# Patient Record
Sex: Male | Born: 1935 | Race: White | Hispanic: No | Marital: Married | Smoking: Never smoker
Health system: Southern US, Community
[De-identification: ages and names within clinical notes are randomized; demographics above are authoritative.]

## PROBLEM LIST (undated history)

## (undated) DIAGNOSIS — F0390 Unspecified dementia without behavioral disturbance: Secondary | ICD-10-CM

## (undated) DIAGNOSIS — E119 Type 2 diabetes mellitus without complications: Secondary | ICD-10-CM

---

## 2017-01-20 ENCOUNTER — Other Ambulatory Visit: Payer: Self-pay | Admitting: Internal Medicine

## 2017-01-20 DIAGNOSIS — R413 Other amnesia: Secondary | ICD-10-CM

## 2017-02-07 ENCOUNTER — Ambulatory Visit
Admission: RE | Admit: 2017-02-07 | Discharge: 2017-02-07 | Disposition: A | Discharge status: Home / Self Care | Payer: Medicare PPO | Source: Ambulatory Visit | Attending: Internal Medicine | Admitting: Internal Medicine

## 2017-02-07 DIAGNOSIS — R413 Other amnesia: Secondary | ICD-10-CM

## 2017-04-19 DIAGNOSIS — Z681 Body mass index (BMI) 19 or less, adult: Secondary | ICD-10-CM | POA: Diagnosis not present

## 2017-04-19 DIAGNOSIS — E119 Type 2 diabetes mellitus without complications: Secondary | ICD-10-CM | POA: Diagnosis not present

## 2017-04-19 DIAGNOSIS — R413 Other amnesia: Secondary | ICD-10-CM | POA: Diagnosis not present

## 2017-04-19 DIAGNOSIS — D696 Thrombocytopenia, unspecified: Secondary | ICD-10-CM | POA: Diagnosis not present

## 2017-04-19 DIAGNOSIS — E559 Vitamin D deficiency, unspecified: Secondary | ICD-10-CM | POA: Diagnosis not present

## 2017-04-19 DIAGNOSIS — R945 Abnormal results of liver function studies: Secondary | ICD-10-CM | POA: Diagnosis not present

## 2017-08-10 DIAGNOSIS — E119 Type 2 diabetes mellitus without complications: Secondary | ICD-10-CM | POA: Diagnosis not present

## 2017-08-10 DIAGNOSIS — Z681 Body mass index (BMI) 19 or less, adult: Secondary | ICD-10-CM | POA: Diagnosis not present

## 2017-08-10 DIAGNOSIS — K8681 Exocrine pancreatic insufficiency: Secondary | ICD-10-CM | POA: Diagnosis not present

## 2017-08-10 DIAGNOSIS — R634 Abnormal weight loss: Secondary | ICD-10-CM | POA: Diagnosis not present

## 2017-08-10 DIAGNOSIS — R413 Other amnesia: Secondary | ICD-10-CM | POA: Diagnosis not present

## 2017-09-02 DIAGNOSIS — H903 Sensorineural hearing loss, bilateral: Secondary | ICD-10-CM | POA: Diagnosis not present

## 2017-09-02 DIAGNOSIS — H6122 Impacted cerumen, left ear: Secondary | ICD-10-CM | POA: Diagnosis not present

## 2017-09-07 DIAGNOSIS — H903 Sensorineural hearing loss, bilateral: Secondary | ICD-10-CM | POA: Diagnosis not present

## 2017-11-23 DIAGNOSIS — R634 Abnormal weight loss: Secondary | ICD-10-CM | POA: Diagnosis not present

## 2017-11-23 DIAGNOSIS — R600 Localized edema: Secondary | ICD-10-CM | POA: Diagnosis not present

## 2017-11-23 DIAGNOSIS — E1169 Type 2 diabetes mellitus with other specified complication: Secondary | ICD-10-CM | POA: Diagnosis not present

## 2017-11-23 DIAGNOSIS — Z681 Body mass index (BMI) 19 or less, adult: Secondary | ICD-10-CM | POA: Diagnosis not present

## 2017-11-23 DIAGNOSIS — Z23 Encounter for immunization: Secondary | ICD-10-CM | POA: Diagnosis not present

## 2017-11-23 DIAGNOSIS — K8681 Exocrine pancreatic insufficiency: Secondary | ICD-10-CM | POA: Diagnosis not present

## 2017-11-23 DIAGNOSIS — R413 Other amnesia: Secondary | ICD-10-CM | POA: Diagnosis not present

## 2017-11-23 DIAGNOSIS — D696 Thrombocytopenia, unspecified: Secondary | ICD-10-CM | POA: Diagnosis not present

## 2018-02-27 DIAGNOSIS — E1169 Type 2 diabetes mellitus with other specified complication: Secondary | ICD-10-CM | POA: Diagnosis not present

## 2018-02-27 DIAGNOSIS — Z79899 Other long term (current) drug therapy: Secondary | ICD-10-CM | POA: Diagnosis not present

## 2018-02-27 DIAGNOSIS — R82998 Other abnormal findings in urine: Secondary | ICD-10-CM | POA: Diagnosis not present

## 2018-03-02 DIAGNOSIS — E559 Vitamin D deficiency, unspecified: Secondary | ICD-10-CM | POA: Diagnosis not present

## 2018-03-02 DIAGNOSIS — Z681 Body mass index (BMI) 19 or less, adult: Secondary | ICD-10-CM | POA: Diagnosis not present

## 2018-03-02 DIAGNOSIS — E876 Hypokalemia: Secondary | ICD-10-CM | POA: Diagnosis not present

## 2018-03-02 DIAGNOSIS — E1169 Type 2 diabetes mellitus with other specified complication: Secondary | ICD-10-CM | POA: Diagnosis not present

## 2018-03-02 DIAGNOSIS — R945 Abnormal results of liver function studies: Secondary | ICD-10-CM | POA: Diagnosis not present

## 2018-03-02 DIAGNOSIS — R808 Other proteinuria: Secondary | ICD-10-CM | POA: Diagnosis not present

## 2018-03-02 DIAGNOSIS — K8681 Exocrine pancreatic insufficiency: Secondary | ICD-10-CM | POA: Diagnosis not present

## 2018-03-02 DIAGNOSIS — Z0000 Encounter for general adult medical examination without abnormal findings: Secondary | ICD-10-CM | POA: Diagnosis not present

## 2018-03-02 DIAGNOSIS — Z1389 Encounter for screening for other disorder: Secondary | ICD-10-CM | POA: Diagnosis not present

## 2018-03-02 DIAGNOSIS — D696 Thrombocytopenia, unspecified: Secondary | ICD-10-CM | POA: Diagnosis not present

## 2018-03-02 DIAGNOSIS — R413 Other amnesia: Secondary | ICD-10-CM | POA: Diagnosis not present

## 2018-04-24 DIAGNOSIS — B86 Scabies: Secondary | ICD-10-CM | POA: Diagnosis not present

## 2018-08-09 DIAGNOSIS — E1169 Type 2 diabetes mellitus with other specified complication: Secondary | ICD-10-CM | POA: Diagnosis not present

## 2018-08-16 DIAGNOSIS — E1169 Type 2 diabetes mellitus with other specified complication: Secondary | ICD-10-CM | POA: Diagnosis not present

## 2018-08-16 DIAGNOSIS — F0390 Unspecified dementia without behavioral disturbance: Secondary | ICD-10-CM | POA: Diagnosis not present

## 2018-08-16 DIAGNOSIS — K8681 Exocrine pancreatic insufficiency: Secondary | ICD-10-CM | POA: Diagnosis not present

## 2018-08-16 DIAGNOSIS — E559 Vitamin D deficiency, unspecified: Secondary | ICD-10-CM | POA: Diagnosis not present

## 2018-08-16 DIAGNOSIS — R609 Edema, unspecified: Secondary | ICD-10-CM | POA: Diagnosis not present

## 2018-08-16 DIAGNOSIS — D696 Thrombocytopenia, unspecified: Secondary | ICD-10-CM | POA: Diagnosis not present

## 2018-12-29 DIAGNOSIS — Z23 Encounter for immunization: Secondary | ICD-10-CM | POA: Diagnosis not present

## 2019-04-12 DIAGNOSIS — R7989 Other specified abnormal findings of blood chemistry: Secondary | ICD-10-CM | POA: Diagnosis not present

## 2019-04-12 DIAGNOSIS — E559 Vitamin D deficiency, unspecified: Secondary | ICD-10-CM | POA: Diagnosis not present

## 2019-04-12 DIAGNOSIS — E1169 Type 2 diabetes mellitus with other specified complication: Secondary | ICD-10-CM | POA: Diagnosis not present

## 2019-04-12 DIAGNOSIS — Z125 Encounter for screening for malignant neoplasm of prostate: Secondary | ICD-10-CM | POA: Diagnosis not present

## 2019-04-16 DIAGNOSIS — E1169 Type 2 diabetes mellitus with other specified complication: Secondary | ICD-10-CM | POA: Diagnosis not present

## 2019-04-16 DIAGNOSIS — R82998 Other abnormal findings in urine: Secondary | ICD-10-CM | POA: Diagnosis not present

## 2019-04-18 DIAGNOSIS — F0390 Unspecified dementia without behavioral disturbance: Secondary | ICD-10-CM | POA: Diagnosis not present

## 2019-04-18 DIAGNOSIS — E876 Hypokalemia: Secondary | ICD-10-CM | POA: Diagnosis not present

## 2019-04-18 DIAGNOSIS — Z1331 Encounter for screening for depression: Secondary | ICD-10-CM | POA: Diagnosis not present

## 2019-04-18 DIAGNOSIS — Z0000 Encounter for general adult medical examination without abnormal findings: Secondary | ICD-10-CM | POA: Diagnosis not present

## 2019-04-18 DIAGNOSIS — E559 Vitamin D deficiency, unspecified: Secondary | ICD-10-CM | POA: Diagnosis not present

## 2019-04-18 DIAGNOSIS — R945 Abnormal results of liver function studies: Secondary | ICD-10-CM | POA: Diagnosis not present

## 2019-04-18 DIAGNOSIS — R634 Abnormal weight loss: Secondary | ICD-10-CM | POA: Diagnosis not present

## 2019-04-18 DIAGNOSIS — D696 Thrombocytopenia, unspecified: Secondary | ICD-10-CM | POA: Diagnosis not present

## 2019-04-18 DIAGNOSIS — E1169 Type 2 diabetes mellitus with other specified complication: Secondary | ICD-10-CM | POA: Diagnosis not present

## 2019-04-18 DIAGNOSIS — K8681 Exocrine pancreatic insufficiency: Secondary | ICD-10-CM | POA: Diagnosis not present

## 2019-08-28 DIAGNOSIS — E1169 Type 2 diabetes mellitus with other specified complication: Secondary | ICD-10-CM | POA: Diagnosis not present

## 2020-01-16 DIAGNOSIS — D696 Thrombocytopenia, unspecified: Secondary | ICD-10-CM | POA: Diagnosis not present

## 2020-01-16 DIAGNOSIS — F0390 Unspecified dementia without behavioral disturbance: Secondary | ICD-10-CM | POA: Diagnosis not present

## 2020-01-16 DIAGNOSIS — K8681 Exocrine pancreatic insufficiency: Secondary | ICD-10-CM | POA: Diagnosis not present

## 2020-01-16 DIAGNOSIS — E1169 Type 2 diabetes mellitus with other specified complication: Secondary | ICD-10-CM | POA: Diagnosis not present

## 2020-01-16 DIAGNOSIS — E876 Hypokalemia: Secondary | ICD-10-CM | POA: Diagnosis not present

## 2020-04-22 DIAGNOSIS — Z125 Encounter for screening for malignant neoplasm of prostate: Secondary | ICD-10-CM | POA: Diagnosis not present

## 2020-04-22 DIAGNOSIS — E1169 Type 2 diabetes mellitus with other specified complication: Secondary | ICD-10-CM | POA: Diagnosis not present

## 2020-04-22 DIAGNOSIS — E559 Vitamin D deficiency, unspecified: Secondary | ICD-10-CM | POA: Diagnosis not present

## 2020-04-30 DIAGNOSIS — Z0000 Encounter for general adult medical examination without abnormal findings: Secondary | ICD-10-CM | POA: Diagnosis not present

## 2020-04-30 DIAGNOSIS — K8681 Exocrine pancreatic insufficiency: Secondary | ICD-10-CM | POA: Diagnosis not present

## 2020-04-30 DIAGNOSIS — Z23 Encounter for immunization: Secondary | ICD-10-CM | POA: Diagnosis not present

## 2020-04-30 DIAGNOSIS — R609 Edema, unspecified: Secondary | ICD-10-CM | POA: Diagnosis not present

## 2020-04-30 DIAGNOSIS — E1169 Type 2 diabetes mellitus with other specified complication: Secondary | ICD-10-CM | POA: Diagnosis not present

## 2020-04-30 DIAGNOSIS — F0390 Unspecified dementia without behavioral disturbance: Secondary | ICD-10-CM | POA: Diagnosis not present

## 2020-04-30 DIAGNOSIS — E559 Vitamin D deficiency, unspecified: Secondary | ICD-10-CM | POA: Diagnosis not present

## 2020-04-30 DIAGNOSIS — R82998 Other abnormal findings in urine: Secondary | ICD-10-CM | POA: Diagnosis not present

## 2020-04-30 DIAGNOSIS — L989 Disorder of the skin and subcutaneous tissue, unspecified: Secondary | ICD-10-CM | POA: Diagnosis not present

## 2020-04-30 DIAGNOSIS — R809 Proteinuria, unspecified: Secondary | ICD-10-CM | POA: Diagnosis not present

## 2020-04-30 DIAGNOSIS — R413 Other amnesia: Secondary | ICD-10-CM | POA: Diagnosis not present

## 2020-05-19 DIAGNOSIS — Z1159 Encounter for screening for other viral diseases: Secondary | ICD-10-CM | POA: Diagnosis not present

## 2020-05-19 DIAGNOSIS — Z20828 Contact with and (suspected) exposure to other viral communicable diseases: Secondary | ICD-10-CM | POA: Diagnosis not present

## 2020-05-26 DIAGNOSIS — Z20828 Contact with and (suspected) exposure to other viral communicable diseases: Secondary | ICD-10-CM | POA: Diagnosis not present

## 2020-05-26 DIAGNOSIS — Z1159 Encounter for screening for other viral diseases: Secondary | ICD-10-CM | POA: Diagnosis not present

## 2020-06-02 DIAGNOSIS — Z20828 Contact with and (suspected) exposure to other viral communicable diseases: Secondary | ICD-10-CM | POA: Diagnosis not present

## 2020-06-02 DIAGNOSIS — Z1159 Encounter for screening for other viral diseases: Secondary | ICD-10-CM | POA: Diagnosis not present

## 2020-06-09 DIAGNOSIS — Z1159 Encounter for screening for other viral diseases: Secondary | ICD-10-CM | POA: Diagnosis not present

## 2020-06-09 DIAGNOSIS — Z20828 Contact with and (suspected) exposure to other viral communicable diseases: Secondary | ICD-10-CM | POA: Diagnosis not present

## 2020-06-16 DIAGNOSIS — Z1159 Encounter for screening for other viral diseases: Secondary | ICD-10-CM | POA: Diagnosis not present

## 2020-06-16 DIAGNOSIS — Z20828 Contact with and (suspected) exposure to other viral communicable diseases: Secondary | ICD-10-CM | POA: Diagnosis not present

## 2020-06-23 DIAGNOSIS — Z20828 Contact with and (suspected) exposure to other viral communicable diseases: Secondary | ICD-10-CM | POA: Diagnosis not present

## 2020-06-23 DIAGNOSIS — Z1159 Encounter for screening for other viral diseases: Secondary | ICD-10-CM | POA: Diagnosis not present

## 2020-06-30 DIAGNOSIS — Z1159 Encounter for screening for other viral diseases: Secondary | ICD-10-CM | POA: Diagnosis not present

## 2020-06-30 DIAGNOSIS — Z20828 Contact with and (suspected) exposure to other viral communicable diseases: Secondary | ICD-10-CM | POA: Diagnosis not present

## 2020-07-07 DIAGNOSIS — Z1159 Encounter for screening for other viral diseases: Secondary | ICD-10-CM | POA: Diagnosis not present

## 2020-07-07 DIAGNOSIS — Z20828 Contact with and (suspected) exposure to other viral communicable diseases: Secondary | ICD-10-CM | POA: Diagnosis not present

## 2020-07-14 DIAGNOSIS — Z1159 Encounter for screening for other viral diseases: Secondary | ICD-10-CM | POA: Diagnosis not present

## 2020-07-14 DIAGNOSIS — Z20828 Contact with and (suspected) exposure to other viral communicable diseases: Secondary | ICD-10-CM | POA: Diagnosis not present

## 2020-07-21 DIAGNOSIS — Z20828 Contact with and (suspected) exposure to other viral communicable diseases: Secondary | ICD-10-CM | POA: Diagnosis not present

## 2020-07-21 DIAGNOSIS — Z1159 Encounter for screening for other viral diseases: Secondary | ICD-10-CM | POA: Diagnosis not present

## 2020-07-28 DIAGNOSIS — Z20828 Contact with and (suspected) exposure to other viral communicable diseases: Secondary | ICD-10-CM | POA: Diagnosis not present

## 2020-07-28 DIAGNOSIS — Z1159 Encounter for screening for other viral diseases: Secondary | ICD-10-CM | POA: Diagnosis not present

## 2020-08-04 DIAGNOSIS — Z1159 Encounter for screening for other viral diseases: Secondary | ICD-10-CM | POA: Diagnosis not present

## 2020-08-04 DIAGNOSIS — Z20828 Contact with and (suspected) exposure to other viral communicable diseases: Secondary | ICD-10-CM | POA: Diagnosis not present

## 2020-08-11 DIAGNOSIS — Z1159 Encounter for screening for other viral diseases: Secondary | ICD-10-CM | POA: Diagnosis not present

## 2020-08-11 DIAGNOSIS — Z20828 Contact with and (suspected) exposure to other viral communicable diseases: Secondary | ICD-10-CM | POA: Diagnosis not present

## 2020-08-18 DIAGNOSIS — Z20828 Contact with and (suspected) exposure to other viral communicable diseases: Secondary | ICD-10-CM | POA: Diagnosis not present

## 2020-08-18 DIAGNOSIS — Z1159 Encounter for screening for other viral diseases: Secondary | ICD-10-CM | POA: Diagnosis not present

## 2020-08-25 DIAGNOSIS — Z20828 Contact with and (suspected) exposure to other viral communicable diseases: Secondary | ICD-10-CM | POA: Diagnosis not present

## 2020-08-25 DIAGNOSIS — Z1159 Encounter for screening for other viral diseases: Secondary | ICD-10-CM | POA: Diagnosis not present

## 2020-09-01 DIAGNOSIS — Z1159 Encounter for screening for other viral diseases: Secondary | ICD-10-CM | POA: Diagnosis not present

## 2020-09-01 DIAGNOSIS — Z20828 Contact with and (suspected) exposure to other viral communicable diseases: Secondary | ICD-10-CM | POA: Diagnosis not present

## 2020-09-08 DIAGNOSIS — Z1159 Encounter for screening for other viral diseases: Secondary | ICD-10-CM | POA: Diagnosis not present

## 2020-09-08 DIAGNOSIS — Z20828 Contact with and (suspected) exposure to other viral communicable diseases: Secondary | ICD-10-CM | POA: Diagnosis not present

## 2020-09-15 DIAGNOSIS — Z1159 Encounter for screening for other viral diseases: Secondary | ICD-10-CM | POA: Diagnosis not present

## 2020-09-15 DIAGNOSIS — Z20828 Contact with and (suspected) exposure to other viral communicable diseases: Secondary | ICD-10-CM | POA: Diagnosis not present

## 2020-09-22 DIAGNOSIS — Z20828 Contact with and (suspected) exposure to other viral communicable diseases: Secondary | ICD-10-CM | POA: Diagnosis not present

## 2020-09-22 DIAGNOSIS — Z1159 Encounter for screening for other viral diseases: Secondary | ICD-10-CM | POA: Diagnosis not present

## 2020-10-06 DIAGNOSIS — Z1159 Encounter for screening for other viral diseases: Secondary | ICD-10-CM | POA: Diagnosis not present

## 2020-10-06 DIAGNOSIS — Z20828 Contact with and (suspected) exposure to other viral communicable diseases: Secondary | ICD-10-CM | POA: Diagnosis not present

## 2020-10-13 DIAGNOSIS — Z1159 Encounter for screening for other viral diseases: Secondary | ICD-10-CM | POA: Diagnosis not present

## 2020-10-13 DIAGNOSIS — Z20828 Contact with and (suspected) exposure to other viral communicable diseases: Secondary | ICD-10-CM | POA: Diagnosis not present

## 2020-11-12 DIAGNOSIS — F0390 Unspecified dementia without behavioral disturbance: Secondary | ICD-10-CM | POA: Diagnosis not present

## 2020-11-12 DIAGNOSIS — E1169 Type 2 diabetes mellitus with other specified complication: Secondary | ICD-10-CM | POA: Diagnosis not present

## 2020-11-12 DIAGNOSIS — D696 Thrombocytopenia, unspecified: Secondary | ICD-10-CM | POA: Diagnosis not present

## 2020-12-12 DIAGNOSIS — E1169 Type 2 diabetes mellitus with other specified complication: Secondary | ICD-10-CM | POA: Diagnosis not present

## 2020-12-12 DIAGNOSIS — D696 Thrombocytopenia, unspecified: Secondary | ICD-10-CM | POA: Diagnosis not present

## 2020-12-12 DIAGNOSIS — F0390 Unspecified dementia without behavioral disturbance: Secondary | ICD-10-CM | POA: Diagnosis not present

## 2021-02-11 DIAGNOSIS — F0390 Unspecified dementia without behavioral disturbance: Secondary | ICD-10-CM | POA: Diagnosis not present

## 2021-02-11 DIAGNOSIS — E1169 Type 2 diabetes mellitus with other specified complication: Secondary | ICD-10-CM | POA: Diagnosis not present

## 2021-02-11 DIAGNOSIS — K8681 Exocrine pancreatic insufficiency: Secondary | ICD-10-CM | POA: Diagnosis not present

## 2021-02-23 DIAGNOSIS — F0390 Unspecified dementia without behavioral disturbance: Secondary | ICD-10-CM | POA: Diagnosis not present

## 2021-02-23 DIAGNOSIS — R609 Edema, unspecified: Secondary | ICD-10-CM | POA: Diagnosis not present

## 2021-02-23 DIAGNOSIS — R945 Abnormal results of liver function studies: Secondary | ICD-10-CM | POA: Diagnosis not present

## 2021-02-23 DIAGNOSIS — D696 Thrombocytopenia, unspecified: Secondary | ICD-10-CM | POA: Diagnosis not present

## 2021-02-23 DIAGNOSIS — E1169 Type 2 diabetes mellitus with other specified complication: Secondary | ICD-10-CM | POA: Diagnosis not present

## 2021-02-23 DIAGNOSIS — K8681 Exocrine pancreatic insufficiency: Secondary | ICD-10-CM | POA: Diagnosis not present

## 2021-02-23 DIAGNOSIS — Z23 Encounter for immunization: Secondary | ICD-10-CM | POA: Diagnosis not present

## 2021-02-23 DIAGNOSIS — E876 Hypokalemia: Secondary | ICD-10-CM | POA: Diagnosis not present

## 2021-02-23 DIAGNOSIS — R634 Abnormal weight loss: Secondary | ICD-10-CM | POA: Diagnosis not present

## 2021-02-25 DIAGNOSIS — Z1159 Encounter for screening for other viral diseases: Secondary | ICD-10-CM | POA: Diagnosis not present

## 2021-02-25 DIAGNOSIS — Z20828 Contact with and (suspected) exposure to other viral communicable diseases: Secondary | ICD-10-CM | POA: Diagnosis not present

## 2021-03-09 DIAGNOSIS — Z1159 Encounter for screening for other viral diseases: Secondary | ICD-10-CM | POA: Diagnosis not present

## 2021-03-09 DIAGNOSIS — Z20828 Contact with and (suspected) exposure to other viral communicable diseases: Secondary | ICD-10-CM | POA: Diagnosis not present

## 2021-03-11 DIAGNOSIS — Z1159 Encounter for screening for other viral diseases: Secondary | ICD-10-CM | POA: Diagnosis not present

## 2021-03-11 DIAGNOSIS — Z20828 Contact with and (suspected) exposure to other viral communicable diseases: Secondary | ICD-10-CM | POA: Diagnosis not present

## 2021-03-13 DIAGNOSIS — E1169 Type 2 diabetes mellitus with other specified complication: Secondary | ICD-10-CM | POA: Diagnosis not present

## 2021-03-13 DIAGNOSIS — K8681 Exocrine pancreatic insufficiency: Secondary | ICD-10-CM | POA: Diagnosis not present

## 2021-03-13 DIAGNOSIS — F0390 Unspecified dementia without behavioral disturbance: Secondary | ICD-10-CM | POA: Diagnosis not present

## 2021-03-18 DIAGNOSIS — Z20828 Contact with and (suspected) exposure to other viral communicable diseases: Secondary | ICD-10-CM | POA: Diagnosis not present

## 2021-03-18 DIAGNOSIS — Z1159 Encounter for screening for other viral diseases: Secondary | ICD-10-CM | POA: Diagnosis not present

## 2021-04-12 DIAGNOSIS — K8681 Exocrine pancreatic insufficiency: Secondary | ICD-10-CM | POA: Diagnosis not present

## 2021-04-12 DIAGNOSIS — F0390 Unspecified dementia without behavioral disturbance: Secondary | ICD-10-CM | POA: Diagnosis not present

## 2021-04-12 DIAGNOSIS — E1169 Type 2 diabetes mellitus with other specified complication: Secondary | ICD-10-CM | POA: Diagnosis not present

## 2021-04-16 ENCOUNTER — Other Ambulatory Visit: Payer: Self-pay

## 2021-04-16 ENCOUNTER — Inpatient Hospital Stay (HOSPITAL_COMMUNITY)
Admission: EM | Admit: 2021-04-16 | Discharge: 2021-04-28 | DRG: 082 | Disposition: A | Discharge status: Hospice | Payer: Medicare Other | Source: Skilled Nursing Facility | Attending: Internal Medicine | Admitting: Internal Medicine

## 2021-04-16 ENCOUNTER — Emergency Department (HOSPITAL_COMMUNITY): Payer: Medicare Other

## 2021-04-16 ENCOUNTER — Encounter (HOSPITAL_COMMUNITY): Payer: Self-pay

## 2021-04-16 DIAGNOSIS — Z681 Body mass index (BMI) 19 or less, adult: Secondary | ICD-10-CM

## 2021-04-16 DIAGNOSIS — S061XAA Traumatic cerebral edema with loss of consciousness status unknown, initial encounter: Secondary | ICD-10-CM | POA: Diagnosis present

## 2021-04-16 DIAGNOSIS — Z20822 Contact with and (suspected) exposure to covid-19: Secondary | ICD-10-CM | POA: Diagnosis not present

## 2021-04-16 DIAGNOSIS — E876 Hypokalemia: Secondary | ICD-10-CM | POA: Diagnosis present

## 2021-04-16 DIAGNOSIS — S065XAA Traumatic subdural hemorrhage with loss of consciousness status unknown, initial encounter: Secondary | ICD-10-CM | POA: Diagnosis not present

## 2021-04-16 DIAGNOSIS — J3489 Other specified disorders of nose and nasal sinuses: Secondary | ICD-10-CM | POA: Diagnosis not present

## 2021-04-16 DIAGNOSIS — R509 Fever, unspecified: Secondary | ICD-10-CM | POA: Diagnosis not present

## 2021-04-16 DIAGNOSIS — R64 Cachexia: Secondary | ICD-10-CM | POA: Diagnosis not present

## 2021-04-16 DIAGNOSIS — F0280 Dementia in other diseases classified elsewhere without behavioral disturbance: Secondary | ICD-10-CM | POA: Diagnosis present

## 2021-04-16 DIAGNOSIS — R739 Hyperglycemia, unspecified: Secondary | ICD-10-CM

## 2021-04-16 DIAGNOSIS — S0636AA Traumatic hemorrhage of cerebrum, unspecified, with loss of consciousness status unknown, initial encounter: Secondary | ICD-10-CM | POA: Diagnosis not present

## 2021-04-16 DIAGNOSIS — E871 Hypo-osmolality and hyponatremia: Secondary | ICD-10-CM | POA: Diagnosis not present

## 2021-04-16 DIAGNOSIS — G309 Alzheimer's disease, unspecified: Secondary | ICD-10-CM | POA: Diagnosis present

## 2021-04-16 DIAGNOSIS — R638 Other symptoms and signs concerning food and fluid intake: Secondary | ICD-10-CM | POA: Diagnosis not present

## 2021-04-16 DIAGNOSIS — R32 Unspecified urinary incontinence: Secondary | ICD-10-CM | POA: Diagnosis present

## 2021-04-16 DIAGNOSIS — F0390 Unspecified dementia without behavioral disturbance: Secondary | ICD-10-CM | POA: Diagnosis not present

## 2021-04-16 DIAGNOSIS — N179 Acute kidney failure, unspecified: Secondary | ICD-10-CM | POA: Diagnosis not present

## 2021-04-16 DIAGNOSIS — R410 Disorientation, unspecified: Secondary | ICD-10-CM | POA: Diagnosis not present

## 2021-04-16 DIAGNOSIS — Z515 Encounter for palliative care: Secondary | ICD-10-CM | POA: Diagnosis not present

## 2021-04-16 DIAGNOSIS — Z66 Do not resuscitate: Secondary | ICD-10-CM | POA: Diagnosis present

## 2021-04-16 DIAGNOSIS — S0993XA Unspecified injury of face, initial encounter: Secondary | ICD-10-CM | POA: Diagnosis not present

## 2021-04-16 DIAGNOSIS — E1165 Type 2 diabetes mellitus with hyperglycemia: Secondary | ICD-10-CM | POA: Diagnosis not present

## 2021-04-16 DIAGNOSIS — E860 Dehydration: Secondary | ICD-10-CM | POA: Diagnosis present

## 2021-04-16 DIAGNOSIS — L8990 Pressure ulcer of unspecified site, unspecified stage: Secondary | ICD-10-CM | POA: Insufficient documentation

## 2021-04-16 DIAGNOSIS — J690 Pneumonitis due to inhalation of food and vomit: Secondary | ICD-10-CM | POA: Diagnosis not present

## 2021-04-16 DIAGNOSIS — R296 Repeated falls: Secondary | ICD-10-CM | POA: Diagnosis present

## 2021-04-16 DIAGNOSIS — R040 Epistaxis: Secondary | ICD-10-CM | POA: Diagnosis not present

## 2021-04-16 DIAGNOSIS — J9690 Respiratory failure, unspecified, unspecified whether with hypoxia or hypercapnia: Secondary | ICD-10-CM | POA: Diagnosis not present

## 2021-04-16 DIAGNOSIS — E781 Pure hyperglyceridemia: Secondary | ICD-10-CM | POA: Diagnosis present

## 2021-04-16 DIAGNOSIS — L89152 Pressure ulcer of sacral region, stage 2: Secondary | ICD-10-CM | POA: Diagnosis present

## 2021-04-16 DIAGNOSIS — Z7189 Other specified counseling: Secondary | ICD-10-CM | POA: Diagnosis not present

## 2021-04-16 DIAGNOSIS — S31000A Unspecified open wound of lower back and pelvis without penetration into retroperitoneum, initial encounter: Secondary | ICD-10-CM | POA: Diagnosis not present

## 2021-04-16 DIAGNOSIS — J9601 Acute respiratory failure with hypoxia: Secondary | ICD-10-CM | POA: Diagnosis not present

## 2021-04-16 DIAGNOSIS — W19XXXA Unspecified fall, initial encounter: Secondary | ICD-10-CM | POA: Diagnosis present

## 2021-04-16 DIAGNOSIS — I629 Nontraumatic intracranial hemorrhage, unspecified: Secondary | ICD-10-CM | POA: Diagnosis not present

## 2021-04-16 DIAGNOSIS — I619 Nontraumatic intracerebral hemorrhage, unspecified: Secondary | ICD-10-CM | POA: Diagnosis present

## 2021-04-16 DIAGNOSIS — Y92129 Unspecified place in nursing home as the place of occurrence of the external cause: Secondary | ICD-10-CM | POA: Diagnosis not present

## 2021-04-16 DIAGNOSIS — E119 Type 2 diabetes mellitus without complications: Secondary | ICD-10-CM | POA: Diagnosis not present

## 2021-04-16 DIAGNOSIS — E870 Hyperosmolality and hypernatremia: Secondary | ICD-10-CM | POA: Diagnosis not present

## 2021-04-16 DIAGNOSIS — K8689 Other specified diseases of pancreas: Secondary | ICD-10-CM | POA: Diagnosis present

## 2021-04-16 HISTORY — DX: Type 2 diabetes mellitus without complications: E11.9

## 2021-04-16 HISTORY — DX: Unspecified dementia, unspecified severity, without behavioral disturbance, psychotic disturbance, mood disturbance, and anxiety: F03.90

## 2021-04-16 LAB — CBC WITH DIFFERENTIAL/PLATELET
Abs Immature Granulocytes: 0.16 10*3/uL — ABNORMAL HIGH (ref 0.00–0.07)
Basophils Absolute: 0 10*3/uL (ref 0.0–0.1)
Basophils Relative: 0 %
Eosinophils Absolute: 0 10*3/uL (ref 0.0–0.5)
Eosinophils Relative: 0 %
HCT: 35.2 % — ABNORMAL LOW (ref 39.0–52.0)
Hemoglobin: 12.1 g/dL — ABNORMAL LOW (ref 13.0–17.0)
Immature Granulocytes: 1 %
Lymphocytes Relative: 6 %
Lymphs Abs: 0.7 10*3/uL (ref 0.7–4.0)
MCH: 31.3 pg (ref 26.0–34.0)
MCHC: 34.4 g/dL (ref 30.0–36.0)
MCV: 91.2 fL (ref 80.0–100.0)
Monocytes Absolute: 0.5 10*3/uL (ref 0.1–1.0)
Monocytes Relative: 5 %
Neutro Abs: 9.6 10*3/uL — ABNORMAL HIGH (ref 1.7–7.7)
Neutrophils Relative %: 88 %
Platelets: 109 10*3/uL — ABNORMAL LOW (ref 150–400)
RBC: 3.86 MIL/uL — ABNORMAL LOW (ref 4.22–5.81)
RDW: 13.2 % (ref 11.5–15.5)
WBC: 11.1 10*3/uL — ABNORMAL HIGH (ref 4.0–10.5)
nRBC: 0 % (ref 0.0–0.2)

## 2021-04-16 LAB — BASIC METABOLIC PANEL
Anion gap: 13 (ref 5–15)
BUN: 25 mg/dL — ABNORMAL HIGH (ref 8–23)
CO2: 26 mmol/L (ref 22–32)
Calcium: 8.8 mg/dL — ABNORMAL LOW (ref 8.9–10.3)
Chloride: 108 mmol/L (ref 98–111)
Creatinine, Ser: 1.38 mg/dL — ABNORMAL HIGH (ref 0.61–1.24)
GFR, Estimated: 49 mL/min — ABNORMAL LOW (ref >60)
Glucose, Bld: 429 mg/dL — ABNORMAL HIGH (ref 70–99)
Potassium: 3.9 mmol/L (ref 3.5–5.1)
Sodium: 147 mmol/L — ABNORMAL HIGH (ref 135–145)

## 2021-04-16 LAB — CREATININE, SERUM
Creatinine, Ser: 1.29 mg/dL — ABNORMAL HIGH (ref 0.61–1.24)
GFR, Estimated: 53 mL/min — ABNORMAL LOW (ref >60)

## 2021-04-16 LAB — URINALYSIS, ROUTINE W REFLEX MICROSCOPIC
Bilirubin Urine: NEGATIVE
Glucose, UA: >=500 mg/dL — AB
Ketones, ur: 15 mg/dL — AB
Leukocytes,Ua: NEGATIVE
Nitrite: NEGATIVE
Protein, ur: NEGATIVE mg/dL
Specific Gravity, Urine: 1.015 (ref 1.005–1.030)
pH: 5 (ref 5.0–8.0)

## 2021-04-16 LAB — GLUCOSE, CAPILLARY: Glucose-Capillary: 128 mg/dL — ABNORMAL HIGH (ref 70–99)

## 2021-04-16 LAB — CBG MONITORING, ED: Glucose-Capillary: 277 mg/dL — ABNORMAL HIGH (ref 70–99)

## 2021-04-16 LAB — RESP PANEL BY RT-PCR (FLU A&B, COVID) ARPGX2
Influenza A by PCR: NEGATIVE
Influenza B by PCR: NEGATIVE
SARS Coronavirus 2 by RT PCR: NEGATIVE

## 2021-04-16 LAB — URINALYSIS, MICROSCOPIC (REFLEX): Bacteria, UA: NONE SEEN

## 2021-04-16 LAB — HEMOGLOBIN A1C
Hgb A1c MFr Bld: 8.7 % — ABNORMAL HIGH (ref 4.8–5.6)
Mean Plasma Glucose: 202.99 mg/dL

## 2021-04-16 MED ORDER — ONDANSETRON HCL 4 MG/2ML IJ SOLN
4.0000 mg | Freq: Once | INTRAMUSCULAR | Status: AC
  Administered 2021-04-16: 4 mg via INTRAVENOUS
  Filled 2021-04-16: qty 2

## 2021-04-16 MED ORDER — SODIUM CHLORIDE 0.9 % IV SOLN: INTRAVENOUS | Status: DC

## 2021-04-16 MED ORDER — SODIUM CHLORIDE 0.9 % IV BOLUS
500.0000 mL | Freq: Once | INTRAVENOUS | Status: AC
  Administered 2021-04-16: 500 mL via INTRAVENOUS

## 2021-04-16 MED ORDER — INSULIN ASPART 100 UNIT/ML IJ SOLN
5.0000 [IU] | Freq: Once | INTRAMUSCULAR | Status: AC
  Administered 2021-04-16: 5 [IU] via SUBCUTANEOUS

## 2021-04-16 MED ORDER — INSULIN ASPART 100 UNIT/ML IJ SOLN
0.0000 [IU] | Freq: Three times a day (TID) | INTRAMUSCULAR | Status: DC
  Administered 2021-04-17: 2 [IU] via SUBCUTANEOUS

## 2021-04-16 NOTE — ED Notes (Signed)
Pt placed in gown, condom cath in place, pt noted to have a sacral decubitus middle sacral area.

## 2021-04-16 NOTE — H&P (Signed)
Date: 04/16/2021               Patient Name:  John Gilmore MRN: [id number]  DOB: 1930-11-12 Age / Sex: 88 y.o., male   PCP: System, Provider Not In         Medical Service: Internal Medicine Teaching Service         Attending Physician: Dr. Charm Barges, Kayleen Memos, MD    First Contact: Adron Bene, MD Pager: [place] [phone]  Second Contact: Marolyn Haller, MD Pager: [phone]       After Hours (After 5p/  First Contact Pager: [phone]  weekends / holidays): Second Contact Pager: [phone]   SUBJECTIVE   Chief Complaint: fall  History of Present Illness:  88 year old male with a hx of pancreatic insufficiency, diabetes and dementia of 6 years with sharp decline in the last 2-3 who now resides  in [facility] who presented after several falls with altered mental status and is admitted to medicine service for management of blood sugar.  History provided by son who was present at bedside. Son reports that the patient is typically ambulatory, conversant, able to feed and dress self at baseline, but does have difficulty hearing and with memory and requires assistance with medications. On Tuesday night, the patient went to bed at 2100. House staff checked on him some time after that and discovered he had fallen as he was behind the door and had an abrasion on the back of his head. Patient's exam at that time was non-focal and he went to sleep at 0100. The following morning, he went to breakfast yesterday where he was noted to be unstable and fell several more times. He was also noted to have blood in his nostril and mouth. This morning, patient was noted to be unable to stand. He has been sleeping all day, and missed breakfast, which son reports is very out of the ordinary. Son had EMS bring him to the hospital due to acute deterioration in mental status.   Son reports that he has never fallen before. He has appeared tired when walking long distances. Additionally, son reports that  patient has been having frequent episodes of urinary incontinence which has worsened over the last week, however patient did not endorse any suprapubic pain or burning with urination.  ED Course: During evaluation in the ED, patient was noted to have intraparenchymal blood. Case was discussed with neurosurgery who did not feel the need for surgical intervention. Glucose was noted to be elevated and internal medicine teaching service was asked to admit for management of blood sugar.   Meds:  Current Meds  Medication Sig   Cholecalciferol (VITAMIN D) 50 MCG (2000 UT) CAPS Take 2,000 Units by mouth daily.   lipase/protease/amylase (CREON) 12000-38000 units CPEP capsule Take 24,000 Units by mouth 3 (three) times daily before meals.   lipase/protease/amylase (CREON) 36000 UNITS CPEP capsule Take 1 capsule by mouth 2 (two) times daily. With Snacks 1400 & 2100   Multiple Vitamin (MULTIVITAMIN) tablet Take 1 tablet by mouth daily.   Saline (SIMPLY SALINE) 0.9 % AERS Place 1 spray into the nose every hour as needed (dryness/ nosebleed).    Past Medical History:  Diagnosis Date   Dementia (HCC)    Diabetes mellitus without complication (HCC)     History reviewed. No pertinent surgical history.  Social:  Lives With: [facility] Occupation: retired Teacher, early years/pre Support: son Level of Function:  PCP: Rodrigo Ran Substances: drinks socially, no  tobacco use  Family History:   Allergies: Allergies as of 04/16/2021   (No Known Allergies)    Review of Systems: A complete ROS was negative except as per HPI.   OBJECTIVE:   Physical Exam: Blood pressure 133/62, pulse 95, temperature 98.1 F (36.7 C), temperature source Oral, resp. rate 17, SpO2 100 %.  Constitutional: ill appearing male, frail and thin HENT: ecchymoses of right eyelid, dried blood noted in right nostril and on lips, possible ecchymoses on tongue with midline deviation.  Eyes: pupils fixed, pinpoint with right  slightly larger than left Neck: supple Cardiovascular: regular rate and rhythm, no m/r/g Pulmonary/Chest: normal work of breathing on room air, lungs clear to auscultation bilaterally Abdominal: soft, non-tender, non-distended MSK: decreased bulk, normal tone Neurological: does not respond to verbal or tactile stimuli. Pupils are fixed and pinpoint, however, right is slightly larger than left. Absent blink reflex. Moves upper extremities spontaneously. Skin: warm and dry, poor skin turgor Psych: unable to assess  Labs: CBC    Component Value Date/Time   WBC 11.1 (H) 04/16/2021 1245   RBC 3.86 (L) 04/16/2021 1245   HGB 12.1 (L) 04/16/2021 1245   HCT 35.2 (L) 04/16/2021 1245   PLT 109 (L) 04/16/2021 1245   MCV 91.2 04/16/2021 1245   MCH 31.3 04/16/2021 1245   MCHC 34.4 04/16/2021 1245   RDW 13.2 04/16/2021 1245   LYMPHSABS 0.7 04/16/2021 1245   MONOABS 0.5 04/16/2021 1245   EOSABS 0.0 04/16/2021 1245   BASOSABS 0.0 04/16/2021 1245     CMP     Component Value Date/Time   NA 147 (H) 04/16/2021 1245   K 3.9 04/16/2021 1245   CL 108 04/16/2021 1245   CO2 26 04/16/2021 1245   GLUCOSE 429 (H) 04/16/2021 1245   BUN 25 (H) 04/16/2021 1245   CREATININE 1.38 (H) 04/16/2021 1245   CALCIUM 8.8 (L) 04/16/2021 1245   GFRNONAA 49 (L) 04/16/2021 1245    Imaging: CT Head Wo Contrast  Result Date: 04/16/2021 CLINICAL DATA:  Head trauma, fall EXAM: CT HEAD WITHOUT CONTRAST TECHNIQUE: Contiguous axial images were obtained from the base of the skull through the vertex without intravenous contrast. RADIATION DOSE REDUCTION: This exam was performed according to the departmental dose-optimization program which includes automated exposure control, adjustment of the mA and/or kV according to patient size and/or use of iterative reconstruction technique. COMPARISON:  None. FINDINGS: Brain: There is a large right temporal lobe parenchymal hemorrhage measuring at least 4.8 x 4.0 x 2.5 cm with mass  effect on the right temporal horn. No evidence of transtentorial herniation. There is also trace amount of blood adjacent to the right falx cerebelli. Multiple cerebral contusions/parenchymal hemorrhages for example in the left anterior temporal lobe, right anterior temporal lobe, right frontal lobe,. There is also small intraparenchymal hemorrhage in the left parietal lobe adjacent to the central sulcus. There is also small area of contusion/hemorrhage in the superior aspect of the right frontal lobe (axial image 28). There is mass effect on the anterior horn of the right lateral ventricle, which is likely secondary to edema due to large right temporal lobe hemorrhage. No midline shift or subfalcine herniation. There is also small frontal subdural hemorrhage measuring up to 4 mm. Vascular: No hyperdense vessel or unexpected calcification. Skull: Normal. Negative for fracture or focal lesion. Sinuses/Orbits: Mucosal thickening of bilateral maxillary sinuses. No evidence of fracture. Other: None. IMPRESSION: 1. Multiple bilateral parenchymal hemorrhage, the largest in the right temporal lobe measuring at least  4.8 x 4.0 x 2.5 cm with surrounding edema and mass effect on the right temporal horn, without evidence of transtentorial herniation. 2.  Small right frontal subdural hemorrhage measuring up to 4 mm. 3.  No evidence of calvarial fracture. Above findings were reported to Dr. Charm BargesButler at approximately 1:30 p.m. on 04/16/2021. Electronically Signed   By: Larose HiresImran  Ahmed D.O.   On: 04/16/2021 14:03   CT Maxillofacial WO CM  Result Date: 04/16/2021 CLINICAL DATA:  Facial trauma, blunt EXAM: CT MAXILLOFACIAL WITHOUT CONTRAST TECHNIQUE: Multidetector CT imaging of the maxillofacial structures was performed. Multiplanar CT image reconstructions were also generated. RADIATION DOSE REDUCTION: This exam was performed according to the departmental dose-optimization program which includes automated exposure control, adjustment  of the mA and/or kV according to patient size and/or use of iterative reconstruction technique. COMPARISON:  None. FINDINGS: Osseous: No fracture or mandibular dislocation. No destructive process. Orbits: Negative. No traumatic or inflammatory finding. Sinuses: Scattered paranasal sinus mucosal thickening. Soft tissues: Negative. Limited intracranial: Intracranial hemorrhage is noted and partially visualized, better seen and described on separately dictated head CT. IMPRESSION: No acute facial fracture. Acute intracranial hemorrhage, see separately dictated head CT. Electronically Signed   By: Caprice RenshawJacob  Kahn M.D.   On: 04/16/2021 13:41    EKG: personally reviewed my interpretation is sinus rhythm   ASSESSMENT & PLAN:    Assessment & Plan by Problem: Active Problems:   * No active hospital problems. *   Asa Saunashomas David Disla is a 88 y.o. with pertinent PMH hx of pancreatic insufficiency, diabetes and dementia of 6 years with sharp decline in the last 2-3 who now resides  in [facility] who presented after several falls with altered mental status and is admitted to medicine service for management of blood sugar. on hospital day 0  # Altered mental status # History of dementia # Intraparenchymal bleed - frequent falls recently, but not on blood thinners. CT head obtained in ED showed multiple bilateral parenchymal hemorrhages with surrounding edema and mass effect without evidence of herniation. Frontal subdural hemorrhage also noted.   - case was discussed with neurosurgery who did not recommend any surgical intervention. Will continue to monitor mental status and neuro exam. If patient does not show any signs of clinical improvement over the next several days, palliative consult may be appropriate.    Diabetes mellitus type 2 - Patient was previously controlled on glipizide, Januvia, metformin, and Jardiance. Patient only taking metformin and Jardiance once daily due to a1c in the 5's.  Glucose likely elevated due to missed medication doses. - Given novolog 5 units and 500cc bolus IVFs. Patient  started on novolog with meals, however, if patient's mental status does not show improvement, he will likely need to be switched to long acting. Will continue with routine glucose checks. - Patient given fluids. - check a1c   # Acute kidney injury Cr 1.38 with baseline around 1.0. Likely due to dehydration in the setting of poor PO intake. Poor skin turgor noted on exam and mucous membranes dry.  - will give IVFs.  Frequent urination ? Underlying UTI vs diuretic effect of hyperglycemia - patient was not reporting any suprapubic pain or dysuria prior to presentation - UA pending  Pancreatic insufficiency - on creon at home  Sacral wound No skin breakdown noted during bandaging. - continue to monitor, keep wound clean, dry, and intact. Frequent repositioning.  Hx of hypertriglyceridemia - most recent LDL appears to be 133   Diet: NPO  VTE:  SCDs IVF:    ,Bolus Code: DNR/DNI  Prior to Admission Living Arrangement:  [facility] Anticipated Discharge Location: SNF Barriers to Discharge: medical stability  Dispo: Admit patient to Inpatient with expected length of stay greater than 2 midnights.  Signed: Adron BeneGawaluck, Advit Trethewey, MD Internal Medicine Resident PGY-1 Pager: [phone]  04/16/2021, 2:49 PM

## 2021-04-16 NOTE — Consult Note (Signed)
Reason for Consult:intracerebral hematoma Referring Physician: ED  John Gilmore is an 88 y.o. male.  HPI: whom fell two days ago at a memory unit striking his head. His son, Dr. Harlow Asa evaluated his father and stayed with him that night for approximately 3 hours until he fell asleep. He appeared to be at his baseline. He is demented, has Alzheimer's disease, but is friendly and active. He was called today stating his father had missed breakfast which was unusual. Dr. Harlow Asa brought his dad to the ED. A head ct showed an extensive hemorrhage in the temporal lobe, due to cerebral atrophy there is no real mass effect. Dr. Harlow Asa has no desire for operative intervention, and John Gilmore is DNR, NO intubation.   Past Medical History:  Diagnosis Date   Dementia ([place])    Diabetes mellitus without complication ([place])     History reviewed. No pertinent surgical history.  No family history on file.  Social History:  has no history on file for tobacco use, alcohol use, and drug use.  Allergies: No Known Allergies  Medications: I have reviewed the patient's current medications.  Results for orders placed or performed during the hospital encounter of 04/16/21 (from the past 48 hour(s))  Resp Panel by RT-PCR (Flu A&B, Covid) Nasopharyngeal Swab     Status: None   Collection Time: 04/16/21 12:15 PM   Specimen: Nasopharyngeal Swab; Nasopharyngeal(NP) swabs in vial transport medium  Result Value Ref Range   SARS Coronavirus 2 by RT PCR NEGATIVE NEGATIVE    Comment: (NOTE) SARS-CoV-2 target nucleic acids are NOT DETECTED.  The SARS-CoV-2 RNA is generally detectable in upper respiratory specimens during the acute phase of infection. The lowest concentration of SARS-CoV-2 viral copies this assay can detect is 138 copies/mL. A negative result does not preclude SARS-Cov-2 infection and should not be used as the sole basis for treatment or other patient management decisions. A negative result may  occur with  improper specimen collection/handling, submission of specimen other than nasopharyngeal swab, presence of viral mutation(s) within the areas targeted by this assay, and inadequate number of viral copies(<138 copies/mL). A negative result must be combined with clinical observations, patient history, and epidemiological information. The expected result is Negative.  Fact Sheet for Patients:  EntrepreneurPulse.com.au  Fact Sheet for Healthcare Providers:  IncredibleEmployment.be  This test is no t yet approved or cleared by the [place] FDA and  has been authorized for detection and/or diagnosis of SARS-CoV-2 by FDA under an Emergency Use Authorization (EUA). This EUA will remain  in effect (meaning this test can be used) for the duration of the COVID-19 declaration under Section 564(b)(1) of the Act, 21 U.S.C.section 360bbb-3(b)(1), unless the authorization is terminated  or revoked sooner.       Influenza A by PCR NEGATIVE NEGATIVE   Influenza B by PCR NEGATIVE NEGATIVE    Comment: (NOTE) The Xpert Xpress SARS-CoV-2/FLU/RSV plus assay is intended as an aid in the diagnosis of influenza from Nasopharyngeal swab specimens and should not be used as a sole basis for treatment. Nasal washings and aspirates are unacceptable for Xpert Xpress SARS-CoV-2/FLU/RSV testing.  Fact Sheet for Patients: EntrepreneurPulse.com.au  Fact Sheet for Healthcare Providers: IncredibleEmployment.be  This test is not yet approved or cleared by the [place] FDA and has been authorized for detection and/or diagnosis of SARS-CoV-2 by FDA under an Emergency Use Authorization (EUA). This EUA will remain in effect (meaning this test can be used) for the duration of the COVID-19 declaration  under Section 564(b)(1) of the Act, 21 U.S.C. section 360bbb-3(b)(1), unless the authorization is terminated  or revoked.  Performed at [facility], [place] [address] [place], [place] [id number]   Basic metabolic panel     Status: Abnormal   Collection Time: 04/16/21 12:45 PM  Result Value Ref Range   Sodium 147 (H) 135 - 145 mmol/L   Potassium 3.9 3.5 - 5.1 mmol/L   Chloride 108 98 - 111 mmol/L   CO2 26 22 - 32 mmol/L   Glucose, Bld 429 (H) 70 - 99 mg/dL    Comment: Glucose reference range applies only to samples taken after fasting for at least 8 hours.   BUN 25 (H) 8 - 23 mg/dL   Creatinine, Ser 1.38 (H) 0.61 - 1.24 mg/dL   Calcium 8.8 (L) 8.9 - 10.3 mg/dL   GFR, Estimated 49 (L) >60 mL/min    Comment: (NOTE) Calculated using the CKD-EPI Creatinine Equation (2021)    Anion gap 13 5 - 15    Comment: Performed at [facility] [address], [place], [place] [address]  CBC with Differential     Status: Abnormal   Collection Time: 04/16/21 12:45 PM  Result Value Ref Range   WBC 11.1 (H) 4.0 - 10.5 K/uL   RBC 3.86 (L) 4.22 - 5.81 MIL/uL   Hemoglobin 12.1 (L) 13.0 - 17.0 g/dL   HCT 35.2 (L) 39.0 - 52.0 %   MCV 91.2 80.0 - 100.0 fL   MCH 31.3 26.0 - 34.0 pg   MCHC 34.4 30.0 - 36.0 g/dL   RDW 13.2 11.5 - 15.5 %   Platelets 109 (L) 150 - 400 K/uL    Comment: Immature Platelet Fraction may be clinically indicated, consider ordering this additional test [id number]    nRBC 0.0 0.0 - 0.2 %   Neutrophils Relative % 88 %   Neutro Abs 9.6 (H) 1.7 - 7.7 K/uL   Lymphocytes Relative 6 %   Lymphs Abs 0.7 0.7 - 4.0 K/uL   Monocytes Relative 5 %   Monocytes Absolute 0.5 0.1 - 1.0 K/uL   Eosinophils Relative 0 %   Eosinophils Absolute 0.0 0.0 - 0.5 K/uL   Basophils Relative 0 %   Basophils Absolute 0.0 0.0 - 0.1 K/uL   Immature Granulocytes 1 %   Abs Immature Granulocytes 0.16 (H) 0.00 - 0.07 K/uL    Comment: Performed at [facility] [address], [place], [place] [address]  CBG monitoring, ED     Status: Abnormal   Collection Time: 04/16/21  4:42 PM  Result Value Ref  Range   Glucose-Capillary 277 (H) 70 - 99 mg/dL    Comment: Glucose reference range applies only to samples taken after fasting for at least 8 hours.  Urinalysis, Routine w reflex microscopic Urine, Clean Catch     Status: Abnormal   Collection Time: 04/16/21  4:52 PM  Result Value Ref Range   Color, Urine YELLOW YELLOW   APPearance CLEAR CLEAR   Specific Gravity, Urine 1.015 1.005 - 1.030   pH 5.0 5.0 - 8.0   Glucose, UA >=500 (A) NEGATIVE mg/dL   Hgb urine dipstick TRACE (A) NEGATIVE   Bilirubin Urine NEGATIVE NEGATIVE   Ketones, ur 15 (A) NEGATIVE mg/dL   Protein, ur NEGATIVE NEGATIVE mg/dL   Nitrite NEGATIVE NEGATIVE   Leukocytes,Ua NEGATIVE NEGATIVE    Comment: Performed at [facility] [address] [place], [place] [address]  Urinalysis,  Microscopic (reflex)     Status: None   Collection Time: 04/16/21  4:52 PM  Result Value Ref Range   RBC / HPF 0-5 0 - 5 RBC/hpf   WBC, UA 0-5 0 - 5 WBC/hpf   Bacteria, UA NONE SEEN NONE SEEN   Squamous Epithelial / LPF 0-5 0 - 5   Mucus PRESENT     Comment: Performed at [facility], [place] [address], [place], [place] [address]    CT Head Wo Contrast  Result Date: 04/16/2021 CLINICAL DATA:  Head trauma, fall EXAM: CT HEAD WITHOUT CONTRAST TECHNIQUE: Contiguous axial images were obtained from the base of the skull through the vertex without intravenous contrast. RADIATION DOSE REDUCTION: This exam was performed according to the departmental dose-optimization program which includes automated exposure control, adjustment of the mA and/or kV according to patient size and/or use of iterative reconstruction technique. COMPARISON:  None. FINDINGS: Brain: There is a large right temporal lobe parenchymal hemorrhage measuring at least 4.8 x 4.0 x 2.5 cm with mass effect on the right temporal horn. No evidence of transtentorial herniation. There is also trace amount of blood adjacent to the right falx cerebelli. Multiple cerebral  contusions/parenchymal hemorrhages for example in the left anterior temporal lobe, right anterior temporal lobe, right frontal lobe,. There is also small intraparenchymal hemorrhage in the left parietal lobe adjacent to the central sulcus. There is also small area of contusion/hemorrhage in the superior aspect of the right frontal lobe (axial image 28). There is mass effect on the anterior horn of the right lateral ventricle, which is likely secondary to edema due to large right temporal lobe hemorrhage. No midline shift or subfalcine herniation. There is also small frontal subdural hemorrhage measuring up to 4 mm. Vascular: No hyperdense vessel or unexpected calcification. Skull: Normal. Negative for fracture or focal lesion. Sinuses/Orbits: Mucosal thickening of bilateral maxillary sinuses. No evidence of fracture. Other: None. IMPRESSION: 1. Multiple bilateral parenchymal hemorrhage, the largest in the right temporal lobe measuring at least 4.8 x 4.0 x 2.5 cm with surrounding edema and mass effect on the right temporal horn, without evidence of transtentorial herniation. 2.  Small right frontal subdural hemorrhage measuring up to 4 mm. 3.  No evidence of calvarial fracture. Above findings were reported to Dr. Melina Copa at approximately 1:30 p.m. on 04/16/2021. Electronically Signed   By: Keane Police D.O.   On: 04/16/2021 14:03   CT Cervical Spine Wo Contrast  Result Date: 04/16/2021 CLINICAL DATA:  Head trauma, fall EXAM: CT HEAD WITHOUT CONTRAST TECHNIQUE: Contiguous axial images were obtained from the base of the skull through the vertex without intravenous contrast. RADIATION DOSE REDUCTION: This exam was performed according to the departmental dose-optimization program which includes automated exposure control, adjustment of the mA and/or kV according to patient size and/or use of iterative reconstruction technique. COMPARISON:  None. FINDINGS: Brain: There is a large right temporal lobe parenchymal  hemorrhage measuring at least 4.8 x 4.0 x 2.5 cm with mass effect on the right temporal horn. No evidence of transtentorial herniation. There is also trace amount of blood adjacent to the right falx cerebelli. Multiple cerebral contusions/parenchymal hemorrhages for example in the left anterior temporal lobe, right anterior temporal lobe, right frontal lobe,. There is also small intraparenchymal hemorrhage in the left parietal lobe adjacent to the central sulcus. There is also small area of contusion/hemorrhage in the superior aspect of the right frontal lobe (axial image 28). There is mass effect on the anterior horn  of the right lateral ventricle, which is likely secondary to edema due to large right temporal lobe hemorrhage. No midline shift or subfalcine herniation. There is also small frontal subdural hemorrhage measuring up to 4 mm. Vascular: No hyperdense vessel or unexpected calcification. Skull: Normal. Negative for fracture or focal lesion. Sinuses/Orbits: Mucosal thickening of bilateral maxillary sinuses. No evidence of fracture. Other: None. IMPRESSION: 1. Multiple bilateral parenchymal hemorrhage, the largest in the right temporal lobe measuring at least 4.8 x 4.0 x 2.5 cm with surrounding edema and mass effect on the right temporal horn, without evidence of transtentorial herniation. 2.  Small right frontal subdural hemorrhage measuring up to 4 mm. 3.  No evidence of calvarial fracture. Above findings were reported to Dr. Melina Copa at approximately 1:30 p.m. on 04/16/2021. Electronically Signed   By: Keane Police D.O.   On: 04/16/2021 14:03   CT Maxillofacial WO CM  Result Date: 04/16/2021 CLINICAL DATA:  Facial trauma, blunt EXAM: CT MAXILLOFACIAL WITHOUT CONTRAST TECHNIQUE: Multidetector CT imaging of the maxillofacial structures was performed. Multiplanar CT image reconstructions were also generated. RADIATION DOSE REDUCTION: This exam was performed according to the departmental dose-optimization  program which includes automated exposure control, adjustment of the mA and/or kV according to patient size and/or use of iterative reconstruction technique. COMPARISON:  None. FINDINGS: Osseous: No fracture or mandibular dislocation. No destructive process. Orbits: Negative. No traumatic or inflammatory finding. Sinuses: Scattered paranasal sinus mucosal thickening. Soft tissues: Negative. Limited intracranial: Intracranial hemorrhage is noted and partially visualized, better seen and described on separately dictated head CT. IMPRESSION: No acute facial fracture. Acute intracranial hemorrhage, see separately dictated head CT. Electronically Signed   By: Maurine Simmering M.D.   On: 04/16/2021 13:41    Review of Systems  Constitutional:  Positive for activity change.  Blood pressure (!) 142/57, pulse 99, temperature 98.1 F (36.7 C), temperature source Oral, resp. rate (!) 25, SpO2 91 %. Physical Exam HENT:     Head:     Comments: Abrasion to scalp posteriorly    Right Ear: External ear normal.     Left Ear: External ear normal.  Eyes:     Pupils: Pupils are equal, round, and reactive to light.  Pulmonary:     Effort: Pulmonary effort is normal.  Abdominal:     General: Abdomen is flat.  Musculoskeletal:     Cervical back: Normal range of motion.  Neurological:     Mental Status: Mental status is at baseline. He is lethargic, disoriented and confused.  Psychiatric:        Attention and Perception: He is inattentive.        Mood and Affect: Affect is flat.        Behavior: Behavior is slowed.        Cognition and Memory: Cognition is impaired. Memory is impaired. He exhibits impaired recent memory and impaired remote memory.    Assessment/Plan: John Gilmore is a 88 y.o. male With a large temporal hemorrhage. He will not be taken to the OR, he will not be resuscitated. I have discussed this at great length with Dr. Harlow Asa and agree with this plan.  No neurosurgical input needed  Ashok Pall 04/16/2021, 7:41 PM

## 2021-04-16 NOTE — ED Notes (Signed)
Pt repositioned and insulin given

## 2021-04-16 NOTE — ED Triage Notes (Signed)
Pt from [facility] 2 days ago, bruising to back of head and right eye, denies blood thinners, normally confused, dementia hx, pts BS [phone] per EMS.

## 2021-04-16 NOTE — Progress Notes (Signed)
Pt's son, Billy Turvey, notified of pt's arrival on to unit. Given location information. Pt initially restless and agitated upon admission with all physical interaction with staff. Pt now resting quietly, bed in lowest position, floor mats in place. Pt NPO at present time. Call light within reach. Pt confused, with intermittent incomprehensible speech.

## 2021-04-16 NOTE — ED Provider Notes (Signed)
[facility]   CSN: [id number] Arrival date & time: 04/16/21  1203     History  Chief Complaint  Patient presents with   Marletta LorFall    John Gilmore is a 88 y.o. male.  Level 5 caveat secondary to dementia.  Patient is coming from his memory care unit for evaluation of injuries from a fall and change in mental status.  Reportedly fell 2 days ago striking his head.  Not on blood thinners.  EMS states that staff has noticed him to be less interactive.  Patient unable to give any history.  Diabetic blood sugar greater than 500.  The history is provided by the EMS personnel.  Fall This is a new problem. The current episode started 2 days ago. The problem has not changed since onset.Associated symptoms comments: depressed mental status. Nothing aggravates the symptoms. Nothing relieves the symptoms. He has tried rest for the symptoms. The treatment provided no relief.      Home Medications Prior to Admission medications   Not on File      Allergies    Patient has no allergy information on record.    Review of Systems   Review of Systems  Unable to perform ROS: Dementia   Physical Exam Updated Vital Signs BP (!) 110/91    Pulse 90    Temp 98.1 F (36.7 C) (Oral)    Resp (!) 21    SpO2 99%  Physical Exam Vitals and nursing note reviewed.  Constitutional:      General: He is not in acute distress.    Appearance: Normal appearance. He is well-developed.  HENT:     Head: Normocephalic.     Comments: He has some bruising in the back of his head and some ecchymosis around his right eye.  No crepitus.  No facial instability.    Mouth/Throat:     Mouth: Mucous membranes are dry.  Eyes:     Conjunctiva/sclera: Conjunctivae normal.  Cardiovascular:     Rate and Rhythm: Normal rate and regular rhythm.     Heart sounds: No murmur heard. Pulmonary:     Effort: Pulmonary effort is normal. No respiratory distress.     Breath  sounds: Normal breath sounds.  Abdominal:     Palpations: Abdomen is soft.     Tenderness: There is no abdominal tenderness. There is no guarding or rebound.  Musculoskeletal:        General: No swelling or deformity. Normal range of motion.     Cervical back: Neck supple.  Skin:    General: Skin is warm and dry.     Capillary Refill: Capillary refill takes less than 2 seconds.     Comments: He has a small area of skin breakdown over his coccyx.  Neurological:     General: No focal deficit present.     Mental Status: He is alert. He is disoriented.     Comments: Patient is awake.  He is not following commands.  He is moving all extremities without any focal deficits.  He does have a tremor of his upper extremities     ED Results / Procedures / Treatments   Labs (all labs ordered are listed, but only abnormal results are displayed) Labs Reviewed  BASIC METABOLIC PANEL - Abnormal; Notable for the following components:      Result Value   Sodium 147 (*)    Glucose, Bld 429 (*)    BUN 25 (*)  Creatinine, Ser 1.38 (*)    Calcium 8.8 (*)    GFR, Estimated 49 (*)    All other components within normal limits  CBC WITH DIFFERENTIAL/PLATELET - Abnormal; Notable for the following components:   WBC 11.1 (*)    RBC 3.86 (*)    Hemoglobin 12.1 (*)    HCT 35.2 (*)    Platelets 109 (*)    Neutro Abs 9.6 (*)    Abs Immature Granulocytes 0.16 (*)    All other components within normal limits  RESP PANEL BY RT-PCR (FLU A&B, COVID) ARPGX2  URINALYSIS, ROUTINE W REFLEX MICROSCOPIC  HEMOGLOBIN A1C  CREATININE, SERUM    EKG EKG Interpretation  Date/Time:  Thursday April 16 2021 12:29:05 EST Ventricular Rate:  88 PR Interval:  112 QRS Duration: 116 QT Interval:  400 QTC Calculation: 484 R Axis:   -88 Text Interpretation: Sinus rhythm with Premature supraventricular complexes Left axis deviation Pulmonary disease pattern Right bundle branch block Abnormal ECG No previous ECGs  available Confirmed by Meridee ScoreButler, Jamicia Haaland [phone]([id number]) on 04/16/2021 12:38:55 PM  Radiology CT Head Wo Contrast  Result Date: 04/16/2021 CLINICAL DATA:  Head trauma, fall EXAM: CT HEAD WITHOUT CONTRAST TECHNIQUE: Contiguous axial images were obtained from the base of the skull through the vertex without intravenous contrast. RADIATION DOSE REDUCTION: This exam was performed according to the departmental dose-optimization program which includes automated exposure control, adjustment of the mA and/or kV according to patient size and/or use of iterative reconstruction technique. COMPARISON:  None. FINDINGS: Brain: There is a large right temporal lobe parenchymal hemorrhage measuring at least 4.8 x 4.0 x 2.5 cm with mass effect on the right temporal horn. No evidence of transtentorial herniation. There is also trace amount of blood adjacent to the right falx cerebelli. Multiple cerebral contusions/parenchymal hemorrhages for example in the left anterior temporal lobe, right anterior temporal lobe, right frontal lobe,. There is also small intraparenchymal hemorrhage in the left parietal lobe adjacent to the central sulcus. There is also small area of contusion/hemorrhage in the superior aspect of the right frontal lobe (axial image 28). There is mass effect on the anterior horn of the right lateral ventricle, which is likely secondary to edema due to large right temporal lobe hemorrhage. No midline shift or subfalcine herniation. There is also small frontal subdural hemorrhage measuring up to 4 mm. Vascular: No hyperdense vessel or unexpected calcification. Skull: Normal. Negative for fracture or focal lesion. Sinuses/Orbits: Mucosal thickening of bilateral maxillary sinuses. No evidence of fracture. Other: None. IMPRESSION: 1. Multiple bilateral parenchymal hemorrhage, the largest in the right temporal lobe measuring at least 4.8 x 4.0 x 2.5 cm with surrounding edema and mass effect on the right temporal horn, without  evidence of transtentorial herniation. 2.  Small right frontal subdural hemorrhage measuring up to 4 mm. 3.  No evidence of calvarial fracture. Above findings were reported to Dr. Charm BargesButler at approximately 1:30 p.m. on 04/16/2021. Electronically Signed   By: Larose HiresImran  Ahmed D.O.   On: 04/16/2021 14:03   CT Cervical Spine Wo Contrast  Result Date: 04/16/2021 CLINICAL DATA:  Head trauma, fall EXAM: CT HEAD WITHOUT CONTRAST TECHNIQUE: Contiguous axial images were obtained from the base of the skull through the vertex without intravenous contrast. RADIATION DOSE REDUCTION: This exam was performed according to the departmental dose-optimization program which includes automated exposure control, adjustment of the mA and/or kV according to patient size and/or use of iterative reconstruction technique. COMPARISON:  None. FINDINGS: Brain: There is a large right  temporal lobe parenchymal hemorrhage measuring at least 4.8 x 4.0 x 2.5 cm with mass effect on the right temporal horn. No evidence of transtentorial herniation. There is also trace amount of blood adjacent to the right falx cerebelli. Multiple cerebral contusions/parenchymal hemorrhages for example in the left anterior temporal lobe, right anterior temporal lobe, right frontal lobe,. There is also small intraparenchymal hemorrhage in the left parietal lobe adjacent to the central sulcus. There is also small area of contusion/hemorrhage in the superior aspect of the right frontal lobe (axial image 28). There is mass effect on the anterior horn of the right lateral ventricle, which is likely secondary to edema due to large right temporal lobe hemorrhage. No midline shift or subfalcine herniation. There is also small frontal subdural hemorrhage measuring up to 4 mm. Vascular: No hyperdense vessel or unexpected calcification. Skull: Normal. Negative for fracture or focal lesion. Sinuses/Orbits: Mucosal thickening of bilateral maxillary sinuses. No evidence of fracture.  Other: None. IMPRESSION: 1. Multiple bilateral parenchymal hemorrhage, the largest in the right temporal lobe measuring at least 4.8 x 4.0 x 2.5 cm with surrounding edema and mass effect on the right temporal horn, without evidence of transtentorial herniation. 2.  Small right frontal subdural hemorrhage measuring up to 4 mm. 3.  No evidence of calvarial fracture. Above findings were reported to Dr. Charm Barges at approximately 1:30 p.m. on 04/16/2021. Electronically Signed   By: Larose Hires D.O.   On: 04/16/2021 14:03   CT Maxillofacial WO CM  Result Date: 04/16/2021 CLINICAL DATA:  Facial trauma, blunt EXAM: CT MAXILLOFACIAL WITHOUT CONTRAST TECHNIQUE: Multidetector CT imaging of the maxillofacial structures was performed. Multiplanar CT image reconstructions were also generated. RADIATION DOSE REDUCTION: This exam was performed according to the departmental dose-optimization program which includes automated exposure control, adjustment of the mA and/or kV according to patient size and/or use of iterative reconstruction technique. COMPARISON:  None. FINDINGS: Osseous: No fracture or mandibular dislocation. No destructive process. Orbits: Negative. No traumatic or inflammatory finding. Sinuses: Scattered paranasal sinus mucosal thickening. Soft tissues: Negative. Limited intracranial: Intracranial hemorrhage is noted and partially visualized, better seen and described on separately dictated head CT. IMPRESSION: No acute facial fracture. Acute intracranial hemorrhage, see separately dictated head CT. Electronically Signed   By: Caprice Renshaw M.D.   On: 04/16/2021 13:41    Procedures .Critical Care Performed by: Terrilee Files, MD Authorized by: Terrilee Files, MD   Critical care provider statement:    Critical care time (minutes):  45   Critical care time was exclusive of:  Separately billable procedures and treating other patients   Critical care was necessary to treat or prevent imminent or  life-threatening deterioration of the following conditions:  CNS failure or compromise   Critical care was time spent personally by me on the following activities:  Development of treatment plan with patient or surrogate, discussions with consultants, evaluation of patient's response to treatment, examination of patient, obtaining history from patient or surrogate, ordering and performing treatments and interventions, ordering and review of laboratory studies, ordering and review of radiographic studies, pulse oximetry, re-evaluation of patient's condition and review of old charts   I assumed direction of critical care for this patient from another provider in my specialty: no      Medications Ordered in ED Medications  0.9 %  sodium chloride infusion ( Intravenous New Bag/Given 04/16/21 1604)  insulin aspart (novoLOG) injection 5 Units (has no administration in time range)  insulin aspart (novoLOG) injection 0-9 Units (  has no administration in time range)  ondansetron (ZOFRAN) injection 4 mg (4 mg Intravenous Given 04/16/21 1325)  sodium chloride 0.9 % bolus 500 mL (0 mLs Intravenous Stopped 04/16/21 1445)    ED Course/ Medical Decision Making/ A&P Clinical Course as of 04/16/21 1628  Thu Apr 16, 2021  1350 Patient was seen and evaluated by Dr. Mikal Plane neurosurgery.  He does not recommend any surgical intervention.  Patient's son states he is a DNR/DNI.  They are both okay for the patient to have a medical admission to the floor. [MB]  1437 Discussed with physician from the internal medicine teaching service who will evaluate the patient for admission. [MB]    Clinical Course User Index [MB] Terrilee Files, MD                           Medical Decision Making Amount and/or Complexity of Data Reviewed Labs: ordered. Radiology: ordered.  Risk Prescription drug management. Decision regarding hospitalization.  This patient complains of change in mental status after a fall; this involves an  extensive number of treatment Options and is a complaint that carries with it a high risk of complications and Morbidity. The differential includes bleed, stroke, fracture, UTI  I ordered, reviewed and interpreted labs, which included CBC with mildly elevated white count, hemoglobin low no priors to compare with, chemistries with elevated glucose elevated BUN and creatinine priors, COVID and flu negative, urinalysis not obtained yet I ordered medication with nausea medication I ordered imaging studies which included head CT, C-spine and max face CT and I independently    visualized and interpreted imaging which showed interparynchmal hemorrhage Additional history obtained from patient's son states he is fallen multiple times over the last few days and has had a progressive change in mental status Previous records obtained and reviewed in epic no recent admissions I consulted neurosurgery Dr. Franky Macho and residents from internal medicine teaching service and discussed lab and imaging findings  Critical Interventions: Work-up patient's altered mental status ultimately found to be intraparenchymal bleed.  After the interventions stated above, I reevaluated the patient and found patient's mental status still to be depressed.  Does not appear to have any focal deficits however.  He will need admitted to the hospital for observation of this bleed.  No surgical intervention planned at this time.          Final Clinical Impression(s) / ED Diagnoses Final diagnoses:  Traumatic hemorrhage of cerebrum with unknown loss of consciousness status, unspecified laterality, initial encounter  Hyperglycemia    Rx / DC Orders ED Discharge Orders     None         Terrilee Files, MD 04/16/21 [phone]

## 2021-04-17 ENCOUNTER — Inpatient Hospital Stay (HOSPITAL_COMMUNITY): Payer: Medicare Other

## 2021-04-17 DIAGNOSIS — R040 Epistaxis: Secondary | ICD-10-CM | POA: Diagnosis not present

## 2021-04-17 DIAGNOSIS — R64 Cachexia: Secondary | ICD-10-CM | POA: Diagnosis present

## 2021-04-17 DIAGNOSIS — Z681 Body mass index (BMI) 19 or less, adult: Secondary | ICD-10-CM | POA: Diagnosis not present

## 2021-04-17 DIAGNOSIS — E119 Type 2 diabetes mellitus without complications: Secondary | ICD-10-CM | POA: Diagnosis not present

## 2021-04-17 DIAGNOSIS — J690 Pneumonitis due to inhalation of food and vomit: Secondary | ICD-10-CM | POA: Diagnosis present

## 2021-04-17 DIAGNOSIS — E860 Dehydration: Secondary | ICD-10-CM | POA: Diagnosis present

## 2021-04-17 DIAGNOSIS — J9601 Acute respiratory failure with hypoxia: Secondary | ICD-10-CM | POA: Diagnosis not present

## 2021-04-17 DIAGNOSIS — R296 Repeated falls: Secondary | ICD-10-CM | POA: Diagnosis present

## 2021-04-17 DIAGNOSIS — E870 Hyperosmolality and hypernatremia: Secondary | ICD-10-CM | POA: Diagnosis not present

## 2021-04-17 DIAGNOSIS — L89152 Pressure ulcer of sacral region, stage 2: Secondary | ICD-10-CM | POA: Diagnosis present

## 2021-04-17 DIAGNOSIS — Z7189 Other specified counseling: Secondary | ICD-10-CM | POA: Diagnosis not present

## 2021-04-17 DIAGNOSIS — E871 Hypo-osmolality and hyponatremia: Secondary | ICD-10-CM | POA: Diagnosis not present

## 2021-04-17 DIAGNOSIS — I619 Nontraumatic intracerebral hemorrhage, unspecified: Secondary | ICD-10-CM | POA: Diagnosis present

## 2021-04-17 DIAGNOSIS — E876 Hypokalemia: Secondary | ICD-10-CM | POA: Diagnosis present

## 2021-04-17 DIAGNOSIS — R32 Unspecified urinary incontinence: Secondary | ICD-10-CM | POA: Diagnosis present

## 2021-04-17 DIAGNOSIS — Y92129 Unspecified place in nursing home as the place of occurrence of the external cause: Secondary | ICD-10-CM | POA: Diagnosis not present

## 2021-04-17 DIAGNOSIS — Z66 Do not resuscitate: Secondary | ICD-10-CM | POA: Diagnosis present

## 2021-04-17 DIAGNOSIS — G309 Alzheimer's disease, unspecified: Secondary | ICD-10-CM | POA: Diagnosis present

## 2021-04-17 DIAGNOSIS — L8990 Pressure ulcer of unspecified site, unspecified stage: Secondary | ICD-10-CM | POA: Insufficient documentation

## 2021-04-17 DIAGNOSIS — W19XXXA Unspecified fall, initial encounter: Secondary | ICD-10-CM | POA: Diagnosis present

## 2021-04-17 DIAGNOSIS — F0280 Dementia in other diseases classified elsewhere without behavioral disturbance: Secondary | ICD-10-CM | POA: Diagnosis present

## 2021-04-17 DIAGNOSIS — N179 Acute kidney failure, unspecified: Secondary | ICD-10-CM | POA: Diagnosis present

## 2021-04-17 DIAGNOSIS — S065XAA Traumatic subdural hemorrhage with loss of consciousness status unknown, initial encounter: Secondary | ICD-10-CM | POA: Diagnosis present

## 2021-04-17 DIAGNOSIS — K8689 Other specified diseases of pancreas: Secondary | ICD-10-CM | POA: Diagnosis present

## 2021-04-17 DIAGNOSIS — Z515 Encounter for palliative care: Secondary | ICD-10-CM | POA: Diagnosis not present

## 2021-04-17 DIAGNOSIS — E1165 Type 2 diabetes mellitus with hyperglycemia: Secondary | ICD-10-CM | POA: Diagnosis present

## 2021-04-17 DIAGNOSIS — E781 Pure hyperglyceridemia: Secondary | ICD-10-CM | POA: Diagnosis present

## 2021-04-17 DIAGNOSIS — S061XAA Traumatic cerebral edema with loss of consciousness status unknown, initial encounter: Secondary | ICD-10-CM | POA: Diagnosis present

## 2021-04-17 DIAGNOSIS — Z20822 Contact with and (suspected) exposure to covid-19: Secondary | ICD-10-CM | POA: Diagnosis present

## 2021-04-17 DIAGNOSIS — S0636AA Traumatic hemorrhage of cerebrum, unspecified, with loss of consciousness status unknown, initial encounter: Secondary | ICD-10-CM | POA: Diagnosis not present

## 2021-04-17 LAB — CBC
HCT: 34.2 % — ABNORMAL LOW (ref 39.0–52.0)
Hemoglobin: 11.8 g/dL — ABNORMAL LOW (ref 13.0–17.0)
MCH: 31.8 pg (ref 26.0–34.0)
MCHC: 34.5 g/dL (ref 30.0–36.0)
MCV: 92.2 fL (ref 80.0–100.0)
Platelets: 120 10*3/uL — ABNORMAL LOW (ref 150–400)
RBC: 3.71 MIL/uL — ABNORMAL LOW (ref 4.22–5.81)
RDW: 13.3 % (ref 11.5–15.5)
WBC: 16 10*3/uL — ABNORMAL HIGH (ref 4.0–10.5)
nRBC: 0 % (ref 0.0–0.2)

## 2021-04-17 LAB — BASIC METABOLIC PANEL
Anion gap: 11 (ref 5–15)
BUN: 28 mg/dL — ABNORMAL HIGH (ref 8–23)
CO2: 26 mmol/L (ref 22–32)
Calcium: 8.5 mg/dL — ABNORMAL LOW (ref 8.9–10.3)
Chloride: 117 mmol/L — ABNORMAL HIGH (ref 98–111)
Creatinine, Ser: 1.15 mg/dL (ref 0.61–1.24)
GFR, Estimated: >60 mL/min (ref >60)
Glucose, Bld: 122 mg/dL — ABNORMAL HIGH (ref 70–99)
Potassium: 3.4 mmol/L — ABNORMAL LOW (ref 3.5–5.1)
Sodium: 154 mmol/L — ABNORMAL HIGH (ref 135–145)

## 2021-04-17 LAB — GLUCOSE, CAPILLARY
Glucose-Capillary: 178 mg/dL — ABNORMAL HIGH (ref 70–99)
Glucose-Capillary: 185 mg/dL — ABNORMAL HIGH (ref 70–99)
Glucose-Capillary: 102 mg/dL — ABNORMAL HIGH (ref 70–99)
Glucose-Capillary: 121 mg/dL — ABNORMAL HIGH (ref 70–99)
Glucose-Capillary: 111 mg/dL — ABNORMAL HIGH (ref 70–99)
Glucose-Capillary: 124 mg/dL — ABNORMAL HIGH (ref 70–99)

## 2021-04-17 LAB — COMPREHENSIVE METABOLIC PANEL
ALT: 34 U/L (ref 0–44)
AST: 23 U/L (ref 15–41)
Albumin: 3.6 g/dL (ref 3.5–5.0)
Alkaline Phosphatase: 63 U/L (ref 38–126)
Anion gap: 16 — ABNORMAL HIGH (ref 5–15)
BUN: 25 mg/dL — ABNORMAL HIGH (ref 8–23)
CO2: 24 mmol/L (ref 22–32)
Calcium: 8.9 mg/dL (ref 8.9–10.3)
Chloride: 112 mmol/L — ABNORMAL HIGH (ref 98–111)
Creatinine, Ser: 1.27 mg/dL — ABNORMAL HIGH (ref 0.61–1.24)
GFR, Estimated: 54 mL/min — ABNORMAL LOW (ref >60)
Glucose, Bld: 192 mg/dL — ABNORMAL HIGH (ref 70–99)
Potassium: 3.5 mmol/L (ref 3.5–5.1)
Sodium: 152 mmol/L — ABNORMAL HIGH (ref 135–145)
Total Bilirubin: 1.5 mg/dL — ABNORMAL HIGH (ref 0.3–1.2)
Total Protein: 5.8 g/dL — ABNORMAL LOW (ref 6.5–8.1)

## 2021-04-17 LAB — MRSA NEXT GEN BY PCR, NASAL: MRSA by PCR Next Gen: NOT DETECTED

## 2021-04-17 MED ORDER — INSULIN ASPART 100 UNIT/ML IJ SOLN
0.0000 [IU] | INTRAMUSCULAR | Status: DC
  Administered 2021-04-17 (×2): 1 [IU] via SUBCUTANEOUS
  Administered 2021-04-17 – 2021-04-18 (×3): 2 [IU] via SUBCUTANEOUS
  Administered 2021-04-18: 1 [IU] via SUBCUTANEOUS
  Administered 2021-04-18: 2 [IU] via SUBCUTANEOUS
  Administered 2021-04-19: 1 [IU] via SUBCUTANEOUS
  Administered 2021-04-19 (×2): 2 [IU] via SUBCUTANEOUS
  Administered 2021-04-19: 1 [IU] via SUBCUTANEOUS
  Administered 2021-04-20: 9 [IU] via SUBCUTANEOUS
  Administered 2021-04-20: 2 [IU] via SUBCUTANEOUS
  Administered 2021-04-20: 1 [IU] via SUBCUTANEOUS
  Administered 2021-04-20: 5 [IU] via SUBCUTANEOUS
  Administered 2021-04-21: 2 [IU] via SUBCUTANEOUS
  Administered 2021-04-21 (×2): 3 [IU] via SUBCUTANEOUS

## 2021-04-17 MED ORDER — ACETAMINOPHEN 650 MG RE SUPP
650.0000 mg | RECTAL | Status: DC | PRN
  Administered 2021-04-17: 650 mg via RECTAL
  Filled 2021-04-17: qty 1

## 2021-04-17 MED ORDER — SODIUM CHLORIDE 0.45 % IV SOLN: INTRAVENOUS | Status: DC

## 2021-04-17 MED ORDER — SODIUM CHLORIDE 0.9 % IV SOLN
3.0000 g | Freq: Three times a day (TID) | INTRAVENOUS | Status: DC
  Administered 2021-04-17 – 2021-04-18 (×3): 3 g via INTRAVENOUS
  Filled 2021-04-17 (×4): qty 8

## 2021-04-17 NOTE — TOC CAGE-AID Note (Signed)
Transition of Care [facility]) - CAGE-AID Screening   Patient Details  Name: John Gilmore MRN: [id number] Date of Birth: [birth year]  Transition of Care [facility]) CM/SW Contact:    Decoda Van C Tarpley-Carter, LCSWA Phone Number: 04/17/2021, 8:36 AM   Clinical Narrative: Pt is unable to participate in Cage Aid. Pt is experiencing dementia.  Xzandria Clevinger Tarpley-Carter, MSW, LCSW-A Pronouns:  She/Her/Hers [place] Transitions of Care Clinical Social Worker Direct Number:  [phone] [email or web address]  CAGE-AID Screening: Substance Abuse Screening unable to be completed due to: : Patient unable to participate (Pt has been diagnosed with dementia, but is currently very confused.)             Substance Abuse Education Offered: No

## 2021-04-17 NOTE — Progress Notes (Signed)
Patient agitated and irritable. Pulling at lines. Previously Yellow M[place]. Assessment consistent with prior. Will continue to monitor, turn, and assist as needed. VS checked every 4 hours. Patient on 3L O2 acute due to desaturation. No distress noted.   04/17/21 2000  Assess: MEWS Score  Level of Consciousness Responds to Pain  SpO2 93 %  O2 Device Nasal Cannula  O2 Flow Rate (L/min) 2 L/min  Assess: MEWS Score  MEWS Temp 0  MEWS Systolic 0  MEWS Pulse 0  MEWS RR 0  MEWS LOC 2  MEWS Score 2  MEWS Score Color Yellow  Assess: if the MEWS score is Yellow or Red  Were vital signs taken at a resting state? Yes  Focused Assessment No change from prior assessment  Early Detection of Sepsis Score *See Row Information* High  MEWS guidelines implemented *See Row Information* No, previously yellow, continue vital signs every 4 hours  Treat  MEWS Interventions Other (Comment);Administered scheduled meds/treatments (continue to monitor. VS every 4 hours)  Pain Scale PAINAD  Pain Score 0  Breathing 0  Negative Vocalization 0  Facial Expression 0  Body Language 0  Consolability 0  PAINAD Score 0  Neuro symptoms relieved by Rest;Relaxation techniques (Comment)  Take Vital Signs  Increase Vital Sign Frequency   (continue to monitor. VS every 4 hours)  Escalate  MEWS: Escalate  (continue to monitor. VS every 4 hours)  Document  Patient Outcome Other (Comment) (stable;agitated and confused;no distress noted)  Progress note created (see row info) Yes

## 2021-04-17 NOTE — Progress Notes (Signed)
Pharmacy Antibiotic Note  John Gilmore is a 88 y.o. male admitted on 04/16/2021 with aspiration pneumonia.  Pharmacy has been consulted for Unasyn dosing.  Plan: Unasyn 3gm q8hr Will monitor for acute changes in renal function and adjust as needed F/u cultures results and de-escalate as appropriate   Weight: 43.4 kg (95 lb 10.9 oz)  Temp (24hrs), Avg:99.4 F (37.4 C), Min:98 F (36.7 C), Max:100.9 F (38.3 C)  Recent Labs  Lab 04/16/21 1245 04/16/21 2045 04/17/21 0204  WBC 11.1*  --  16.0*  CREATININE 1.38* 1.29* 1.27*    CrCl cannot be calculated (Unknown ideal weight.).    No Known Allergies  Thank you for allowing pharmacy to be a part of this patients care.  Donnald Garre, PharmD Clinical Pharmacist  Please check AMION for all [place] numbers After 10:00 PM, call [place] [phone]

## 2021-04-17 NOTE — Progress Notes (Addendum)
HD#0 SUBJECTIVE:  Patient Summary: John Gilmore is a 88 year old male with a hx of pancreatic insufficiency, diabetes and dementia of 6 years with sharp decline in the last 2-3 who now resides  in [facility] memory care who presented after several falls with altered mental status and is admitted to medicine service for management of blood sugar.   Overnight Events: no acute events overnight.     Interm History: Per nursing he has remained lethargic. Continues to mumble incoherently. Not in obvious pain.   OBJECTIVE:  Vital Signs: Vitals:   04/16/21 2340 04/17/21 0348 04/17/21 0835 04/17/21 1221  BP: 120/68 (!) 125/51 131/75 125/77  Pulse: 83 88 97 95  Resp: 16 18 18 20   Temp: 98 F (36.7 C) 98.1 F (36.7 C) 100.2 F (37.9 C) 98.5 F (36.9 C)  TempSrc: Oral Oral Oral Axillary  SpO2:   99% 94%  Weight:       Supplemental O2: Room Air SpO2: 94 %  Filed Weights   04/16/21 2002  Weight: 43.4 kg     Intake/Output Summary (Last 24 hours) at 04/17/2021 1335 Last data filed at 04/16/2021 1445 Gross per 24 hour  Intake 500 ml  Output --  Net 500 ml   Net IO Since Admission: 500 mL [04/17/21 1335]  Physical Exam: Constitutional: ill appearing male, frail and thin HENT: ecchymoses of right eyelid, dried blood noted in right nostril and on lips, possible ecchymoses on tongue with midline deviation.  Eyes: pupils fixed, pinpoint with right slightly larger than left Neck: supple Cardiovascular: regular rate and rhythm, no m/r/g Pulmonary/Chest: normal work of breathing on room air, lungs clear to auscultation bilaterally Abdominal: soft, non-tender, non-distended MSK: decreased bulk, normal tone Neurological: does not respond to verbal or tactile stimuli. Pupils are fixed and pinpoint, however, right is slightly larger than left. Absent blink reflex. Moves upper extremities spontaneously. Skin: warm and dry, poor skin turgor Psych: unable to assess  Patient  Lines/Drains/Airways Status     Active Line/Drains/Airways     Name Placement date Placement time Site Days   Peripheral IV 04/16/21 20 G Anterior;Distal;Upper Antecubital 04/16/21  1213  Antecubital  1   Pressure Injury 04/16/21 Sacrum Mid Stage 2 -  Partial thickness loss of dermis presenting as a shallow open injury with a red, pink wound bed without slough. pink 04/16/21  2010  -- 1            Pertinent Labs: CBC Latest Ref Rng & Units 04/17/2021 04/16/2021  WBC 4.0 - 10.5 K/uL 16.0(H) 11.1(H)  Hemoglobin 13.0 - 17.0 g/dL 11.8(L) 12.1(L)  Hematocrit 39.0 - 52.0 % 34.2(L) 35.2(L)  Platelets 150 - 400 K/uL 120(L) 109(L)    CMP Latest Ref Rng & Units 04/17/2021 04/16/2021 04/16/2021  Glucose 70 - 99 mg/dL 192(H) - 429(H)  BUN 8 - 23 mg/dL 25(H) - 25(H)  Creatinine 0.61 - 1.24 mg/dL 1.27(H) 1.29(H) 1.38(H)  Sodium 135 - 145 mmol/L 152(H) - 147(H)  Potassium 3.5 - 5.1 mmol/L 3.5 - 3.9  Chloride 98 - 111 mmol/L 112(H) - 108  CO2 22 - 32 mmol/L 24 - 26  Calcium 8.9 - 10.3 mg/dL 8.9 - 8.8(L)  Total Protein 6.5 - 8.1 g/dL 5.8(L) - -  Total Bilirubin 0.3 - 1.2 mg/dL 1.5(H) - -  Alkaline Phos 38 - 126 U/L 63 - -  AST 15 - 41 U/L 23 - -  ALT 0 - 44 U/L 34 - -  Recent Labs    04/17/21 0630 04/17/21 0839 04/17/21 1225  GLUCAP 178* 185* 102*     Pertinent Imaging: No results found.  ASSESSMENT/PLAN:  Assessment: Principal Problem:   Subdural hematoma Active Problems:   Pressure injury of skin   ICH (intracerebral hemorrhage) (HCC)   John Gilmore is a 88 y.o. with pertinent PMH of pancreatic insufficiency, diabetes and dementia of 6 years with sharp decline in the last 2-3 who now resides  in [facility] memory care who presented after several falls with altered mental status and is admitted to medicine service for management of blood sugar on hospital day 0  # Altered mental status # History of dementia # Intraparenchymal bleed - frequent falls recently, but not on  blood thinners. CT head obtained in ED showed multiple bilateral parenchymal hemorrhages with surrounding edema and mass effect without evidence of herniation. Frontal subdural hemorrhage also noted.   - ED discussed with neurosurgery who did not recommend any surgical intervention. Will continue to monitor mental status and neuro exam. He did not show much improvement today clinically, still somnolent. If patient does not show any signs of clinical improvement over the next several days, palliative consult may be appropriate.    Hypernatremia - sodium 152 today, up from yesterday. Patient would benefit from being slightly hypernatremic given cerebral edema. He is still requiring fluids for his AKI and DM. Will give half normal at 75cc per hour and recheck BMP at 6pm.   Diabetes mellitus type 2 - Patient was previously controlled on glipizide, Januvia, metformin, and Jardiance. Patient only taking metformin and Jardiance once daily due to a1c in the 5's. Glucose likely elevated due to missed medication doses. - Given novolog 5 units and 500cc bolus IVFs. Patient  started on sliding scale insulin. Blood sugar appears to be improving. Will continue to monitor.  - Patient given fluids. - check a1c    # Acute kidney injury Cr 1.38 with baseline around 1.0. Likely due to dehydration in the setting of poor PO intake. Poor skin turgor noted on exam and mucous membranes dry.  - slowly improving with IVFs. Will continue IVFs.   Frequent urination ? Underlying UTI vs diuretic effect of hyperglycemia - patient was not reporting any suprapubic pain or dysuria prior to presentation - UA significant for glucosuria   Pancreatic insufficiency - on creon at home   Sacral wound No skin breakdown noted during bandaging. - continue to monitor, keep wound clean, dry, and intact. Frequent repositioning.   Hx of hypertriglyceridemia - most recent LDL appears to be 133  Diet: NPO VTE:  SCDs IVF:     ,[place] Code: DNR/DNI Prior to Admission Living Arrangement:  [facility] Anticipated Discharge Location: SNF Barriers to Discharge: medical stability  Dispo: Admit patient to Inpatient with expected length of stay greater than 2 midnights.  Signature: Delene Ruffini, MD  Internal Medicine Resident, PGY-1 [facility]  Pager: [phone] 1:35 PM, 04/17/2021   Please contact the on call pager after 5 pm and on weekends at [phone].

## 2021-04-17 NOTE — Progress Notes (Signed)
°  Transition of Care [facility]) Screening Note   Patient Details  Name: Larri Yehle Date of Birth: 1930/08/18   Transition of Care [facility]) CM/SW Contact:    Baldemar Lenis, LCSW Phone Number: 04/17/2021, 3:38 PM    Transition of Care Department [facility]) has reviewed patient and no TOC needs have been identified at this time; medical workup ongoing. We will continue to monitor patient advancement through interdisciplinary progression rounds. If new patient transition needs arise, please place a TOC consult.

## 2021-04-17 NOTE — Plan of Care (Signed)

## 2021-04-17 NOTE — Progress Notes (Signed)
Date: 04/17/2021  Patient name: John Gilmore  Medical record number: [id number]  Date of birth: 1931-01-11   I have seen and evaluated John Gilmore and discussed their care with the Residency Team.  In brief, patient is an 88 year old male with a past medical history of type 2 diabetes and dementia who presented to the ED with altered mental status status post a fall.  History obtained from son as well as chart.  Son states that at baseline the patient is typically ambulatory and conversant and is able to feed and dress himself.  His mental status has worsened over the last 2 to 3 years and he now resides in the memory care unit.  On Tuesday night, the patient went to bed at 9:00 and staff checked on him after some time I discovered that he had fallen and had an abrasion on the back of his head.  Patient was able to get back into bed and went back to sleep.  Next morning he was noted to have an unsteady gait and difficulty ambulating and fell several more times.  Yesterday morning, patient was unable to stand and was sleeping all day and missed breakfast.  EMS came to evaluate the patient and brought him to the ED for further evaluation.  Son does report that patient's blood sugars have been very high since stopping his Januvia and that he has had frequent episodes of urinary incontinence over the last week.  Today, patient remains lethargic but became agitated on attempting to examine his eyes.  He also did state that he is "cold".  PMHx, Fam Hx, and/or Soc Hx : As per resident admit note  Vitals:   04/17/21 0835 04/17/21 1221  BP: 131/75 125/77  Pulse: 97 95  Resp: 18 20  Temp: 100.2 F (37.9 C) 98.5 F (36.9 C)  SpO2: 99% 94%   General: Lethargic, difficult to arouse, became agitated when attempting to examine his eyes CVS: Regular rate and rhythm, normal heart sounds Lungs: CTA bilaterally Abdomen: Soft, nontender, nondistended, normoactive bowel sounds Extremities: No  edema noted, nontender to palpation Skin: Warm and dry HEENT: Right pupil slightly larger than the left with poor reactivity Neuro: Patient does not respond to verbal stimulus but started moving his arms when we attempted to examine his eyes and became a little agitated.  Does not respond to questions or commands.  Right pupil is noted to be slightly larger than the left with poor reactivity, moves upper extremities spontaneously  Assessment and Plan: I have seen and evaluated the patient as outlined above. I agree with the formulated Assessment and Plan as detailed in the residents' note, with the following changes:   1.  Altered mental status status post fall/intraparenchymal bleed: -Patient presented to the ED with altered mental status status post fall and was found to have multiple bilateral intraparenchymal hemorrhages with surrounding edema and mass effect without evidence of herniation as well as a small frontal subdural hematoma. -Neurosurgery evaluated the patient.  Operative intervention was discussed with the son and he opted for no intervention at this time. -Patient is DNR/DNI -Case discussed with son at bedside.  We will continue to monitor for clinical improvement over the next few days.  If patient has no improvement or worsens we will discuss other options like possible palliative care intervention with the family -Patient was noted to be hyponatremic today with sodium at 152.  He will receive half-normal saline at 75 cc/h and will monitor his sodium  closely -Patient also noted to have an AKI likely secondary to decreased oral intake.  Baseline creatinine is around 1 and creatinine was 1.38 on admission.  Creatinine has improved slightly today.  We will continue to monitor BMP daily -We will continue neurochecks -No further work-up at this time  Earl Lagos, MD 2/3/20232:04 PM

## 2021-04-17 NOTE — Progress Notes (Signed)
Nurse tech called this writer attention to patient excessive bleeding from the nose. This occurred while patient positioned on the left side. MD notified and necessary intervention applied according to MD orders. No distress noted at this time. Will continue to monitor and support patient.

## 2021-04-17 NOTE — Plan of Care (Signed)
Interm Update  Paged this afternoon for new onset hypoxia and epistaxis.  Exam Blood pressure 129/62, pulse 99, temperature (!) 100.9 F (38.3 C), temperature source Axillary, resp. rate 20, weight 43.4 kg, SpO2 (!) 86-89 % on 2L  Ill appearing. Awake. Speech unintelligible. Does not follow commands. Moderate sized blood stain near the head of the bed. Dried blood in the left nare. Rhonchorus breath sounds. Tachycardic  Assessment/Plan: Suspect aspiration pneumonia/pneumonitis in the setting of somnolence and epistaxis. Will start on Unasyn and check a CXR. Fever alone could be due to ICH however, in the context of hypoxia and rhonchi on exam, I consider this less likely. Urine appeared clean on admission so consider this unlikely to be a source of infection.  In regards to the epistaxis, if this becomes a recurrent issue tonight, I would consider placing a rhinorocket.   Elige Radon, MD Internal Medicine Resident PGY-3 [facility] 04/17/2021 5:48 PM

## 2021-04-17 NOTE — Progress Notes (Signed)
Pt has had periods of agitation and restlessness, resistive to care. Mostly lethargic. Nonsensical speech at times, otherwise mute. Very confused, unable to follow commands.

## 2021-04-18 DIAGNOSIS — E119 Type 2 diabetes mellitus without complications: Secondary | ICD-10-CM | POA: Diagnosis not present

## 2021-04-18 DIAGNOSIS — S065XAA Traumatic subdural hemorrhage with loss of consciousness status unknown, initial encounter: Secondary | ICD-10-CM | POA: Diagnosis not present

## 2021-04-18 DIAGNOSIS — S31000A Unspecified open wound of lower back and pelvis without penetration into retroperitoneum, initial encounter: Secondary | ICD-10-CM

## 2021-04-18 DIAGNOSIS — E871 Hypo-osmolality and hyponatremia: Secondary | ICD-10-CM

## 2021-04-18 DIAGNOSIS — J9601 Acute respiratory failure with hypoxia: Secondary | ICD-10-CM

## 2021-04-18 DIAGNOSIS — N179 Acute kidney failure, unspecified: Secondary | ICD-10-CM

## 2021-04-18 DIAGNOSIS — K8689 Other specified diseases of pancreas: Secondary | ICD-10-CM

## 2021-04-18 LAB — GLUCOSE, CAPILLARY
Glucose-Capillary: 104 mg/dL — ABNORMAL HIGH (ref 70–99)
Glucose-Capillary: 152 mg/dL — ABNORMAL HIGH (ref 70–99)
Glucose-Capillary: 149 mg/dL — ABNORMAL HIGH (ref 70–99)
Glucose-Capillary: 172 mg/dL — ABNORMAL HIGH (ref 70–99)
Glucose-Capillary: 189 mg/dL — ABNORMAL HIGH (ref 70–99)
Glucose-Capillary: 155 mg/dL — ABNORMAL HIGH (ref 70–99)

## 2021-04-18 LAB — BASIC METABOLIC PANEL
Anion gap: 10 (ref 5–15)
Anion gap: 13 (ref 5–15)
Anion gap: 14 (ref 5–15)
Anion gap: 15 (ref 5–15)
BUN: 30 mg/dL — ABNORMAL HIGH (ref 8–23)
BUN: 34 mg/dL — ABNORMAL HIGH (ref 8–23)
BUN: 35 mg/dL — ABNORMAL HIGH (ref 8–23)
BUN: 38 mg/dL — ABNORMAL HIGH (ref 8–23)
CO2: 26 mmol/L (ref 22–32)
CO2: 19 mmol/L — ABNORMAL LOW (ref 22–32)
CO2: 17 mmol/L — ABNORMAL LOW (ref 22–32)
CO2: 18 mmol/L — ABNORMAL LOW (ref 22–32)
Calcium: 8.3 mg/dL — ABNORMAL LOW (ref 8.9–10.3)
Calcium: 8 mg/dL — ABNORMAL LOW (ref 8.9–10.3)
Calcium: 8.1 mg/dL — ABNORMAL LOW (ref 8.9–10.3)
Calcium: 8.4 mg/dL — ABNORMAL LOW (ref 8.9–10.3)
Chloride: 115 mmol/L — ABNORMAL HIGH (ref 98–111)
Chloride: 116 mmol/L — ABNORMAL HIGH (ref 98–111)
Chloride: 116 mmol/L — ABNORMAL HIGH (ref 98–111)
Chloride: 118 mmol/L — ABNORMAL HIGH (ref 98–111)
Creatinine, Ser: 1.17 mg/dL (ref 0.61–1.24)
Creatinine, Ser: 1.3 mg/dL — ABNORMAL HIGH (ref 0.61–1.24)
Creatinine, Ser: 1.39 mg/dL — ABNORMAL HIGH (ref 0.61–1.24)
Creatinine, Ser: 1.46 mg/dL — ABNORMAL HIGH (ref 0.61–1.24)
GFR, Estimated: 60 mL/min — ABNORMAL LOW (ref >60)
GFR, Estimated: 53 mL/min — ABNORMAL LOW (ref >60)
GFR, Estimated: 49 mL/min — ABNORMAL LOW (ref >60)
GFR, Estimated: 46 mL/min — ABNORMAL LOW (ref >60)
Glucose, Bld: 113 mg/dL — ABNORMAL HIGH (ref 70–99)
Glucose, Bld: 170 mg/dL — ABNORMAL HIGH (ref 70–99)
Glucose, Bld: 175 mg/dL — ABNORMAL HIGH (ref 70–99)
Glucose, Bld: 184 mg/dL — ABNORMAL HIGH (ref 70–99)
Potassium: 2.9 mmol/L — ABNORMAL LOW (ref 3.5–5.1)
Potassium: 3.7 mmol/L (ref 3.5–5.1)
Potassium: 3.9 mmol/L (ref 3.5–5.1)
Potassium: 3.7 mmol/L (ref 3.5–5.1)
Sodium: 151 mmol/L — ABNORMAL HIGH (ref 135–145)
Sodium: 148 mmol/L — ABNORMAL HIGH (ref 135–145)
Sodium: 147 mmol/L — ABNORMAL HIGH (ref 135–145)
Sodium: 151 mmol/L — ABNORMAL HIGH (ref 135–145)

## 2021-04-18 LAB — CBC
HCT: 33.6 % — ABNORMAL LOW (ref 39.0–52.0)
Hemoglobin: 11.2 g/dL — ABNORMAL LOW (ref 13.0–17.0)
MCH: 31.3 pg (ref 26.0–34.0)
MCHC: 33.3 g/dL (ref 30.0–36.0)
MCV: 93.9 fL (ref 80.0–100.0)
Platelets: 87 10*3/uL — ABNORMAL LOW (ref 150–400)
RBC: 3.58 MIL/uL — ABNORMAL LOW (ref 4.22–5.81)
RDW: 13.3 % (ref 11.5–15.5)
WBC: 12.3 10*3/uL — ABNORMAL HIGH (ref 4.0–10.5)
nRBC: 0 % (ref 0.0–0.2)

## 2021-04-18 LAB — MAGNESIUM
Magnesium: 1.5 mg/dL — ABNORMAL LOW (ref 1.7–2.4)
Magnesium: 1.6 mg/dL — ABNORMAL LOW (ref 1.7–2.4)

## 2021-04-18 MED ORDER — CHLORHEXIDINE GLUCONATE 0.12 % MT SOLN
15.0000 mL | Freq: Two times a day (BID) | OROMUCOSAL | Status: DC
  Administered 2021-04-19 – 2021-04-28 (×18): 15 mL via OROMUCOSAL
  Filled 2021-04-18 (×16): qty 15

## 2021-04-18 MED ORDER — ORAL CARE MOUTH RINSE
15.0000 mL | Freq: Two times a day (BID) | OROMUCOSAL | Status: DC
  Administered 2021-04-19 – 2021-04-28 (×20): 15 mL via OROMUCOSAL

## 2021-04-18 MED ORDER — SODIUM CHLORIDE 0.9 % IV SOLN
3.0000 g | Freq: Two times a day (BID) | INTRAVENOUS | Status: DC
  Administered 2021-04-19 (×2): 3 g via INTRAVENOUS
  Filled 2021-04-18 (×2): qty 8

## 2021-04-18 MED ORDER — MAGNESIUM SULFATE 2 GM/50ML IV SOLN
2.0000 g | Freq: Once | INTRAVENOUS | Status: AC
  Administered 2021-04-18: 2 g via INTRAVENOUS
  Filled 2021-04-18: qty 50

## 2021-04-18 MED ORDER — POTASSIUM CHLORIDE 10 MEQ/100ML IV SOLN
10.0000 meq | INTRAVENOUS | Status: AC
  Administered 2021-04-18 (×6): 10 meq via INTRAVENOUS
  Filled 2021-04-18 (×7): qty 100

## 2021-04-18 MED ORDER — OXYMETAZOLINE HCL 0.05 % NA SOLN
1.0000 | Freq: Two times a day (BID) | NASAL | Status: AC
  Administered 2021-04-18 – 2021-04-20 (×4): 1 via NASAL
  Filled 2021-04-18: qty 30

## 2021-04-18 NOTE — Progress Notes (Signed)
PHARMACY NOTE:  ANTIMICROBIAL RENAL DOSAGE ADJUSTMENT  Current antimicrobial regimen includes a mismatch between antimicrobial dosage and estimated renal function.  As per policy approved by the Pharmacy & Therapeutics and Medical Executive Committees, the antimicrobial dosage will be adjusted accordingly.  Current antimicrobial dosage: Ampicillin/sulbactam IV 3g Q8H  Indication: aspiration pneumonia  Renal Function:  Estimated Creatinine Clearance: 26.8 mL/min (by C-G formula based on SCr of 1.17 mg/dL). []      On intermittent HD, scheduled: []      On CRRT    Antimicrobial dosage has been changed to: Ampicillin/sulbactam IV 3g Q12H  Thank you for allowing pharmacy to be a part of this patient's care.  , PharmD, RPh  PGY-2 Pharmacy Resident 04/18/2021 10:38 AM  Please check [email or web address] for unit-specific pharmacy phone numbers.

## 2021-04-18 NOTE — Progress Notes (Signed)
HD#1 SUBJECTIVE:  Patient Summary: John Gilmore is a 88 year old male with a hx of pancreatic insufficiency, diabetes and dementia of 6 years with sharp decline in the last 2-3 who now resides  in [facility] memory care who presented after several falls with altered mental status and is admitted to medicine service for management of elevated blood sugar and acute kidney injury.  Overnight Events:  Yesterday patient had desaturations to the 80s and was febrile.  Chest x-ray showed right lower lobe opacity concerning for aspiration pneumonia.  Patient was started on Unasyn.     Interm History:   Patient more awake and alert however speech is unintelligible.  He is able to purposely move his upper extremities.  OBJECTIVE:  Vital Signs: Vitals:   04/18/21 0302 04/18/21 0400 04/18/21 0740 04/18/21 1131  BP: (!) 114/57  94/76 113/67  Pulse: 91  83 82  Resp: 16  14 16   Temp: 98.4 F (36.9 C)  98.5 F (36.9 C) 98.3 F (36.8 C)  TempSrc: Oral  Oral Oral  SpO2: (!) 87% 92% (!) 85% (!) 86%  Weight:      Height:       Supplemental O2: Room Air SpO2: (!) 86 % O2 Flow Rate (L/min): 3 L/min  Filed Weights   04/16/21 2002  Weight: 43.4 kg     Intake/Output Summary (Last 24 hours) at 04/18/2021 1344 Last data filed at 04/18/2021 0554 Gross per 24 hour  Intake 3876.42 ml  Output 825 ml  Net 3051.42 ml    Net IO Since Admission: 3,551.42 mL [04/18/21 1344]  Physical Exam: Constitutional: ill appearing male, cachectic HENT: ecchymoses of right eyelid, dried blood noted in right nostril and on lips, possible ecchymoses on tongue with midline deviation.  Eyes: pupils fixed, pinpoint with right slightly larger than left Neck: supple Cardiovascular: regular rate and rhythm, no m/r/g Pulmonary/Chest: normal work of breathing on room air, lungs clear to auscultation bilaterally Abdominal: soft, non-tender, non-distended MSK: decreased bulk, Neurological: Opens eyes to  verbal stimulation.  Pupils are fixed and pinpoint, however, right is slightly larger than left. Moves upper extremities spontaneously. Skin: warm and dry, poor skin turgor Psych: unable to assess  Patient Lines/Drains/Airways Status     Active Line/Drains/Airways     Name Placement date Placement time Site Days   Peripheral IV 04/16/21 20 G Anterior;Distal;Upper Antecubital 04/16/21  1213  Antecubital  1   Pressure Injury 04/16/21 Sacrum Mid Stage 2 -  Partial thickness loss of dermis presenting as a shallow open injury with a red, pink wound bed without slough. pink 04/16/21  2010  -- 1            Pertinent Labs: CBC Latest Ref Rng & Units 04/18/2021 04/17/2021 04/16/2021  WBC 4.0 - 10.5 K/uL 12.3(H) 16.0(H) 11.1(H)  Hemoglobin 13.0 - 17.0 g/dL 11.2(L) 11.8(L) 12.1(L)  Hematocrit 39.0 - 52.0 % 33.6(L) 34.2(L) 35.2(L)  Platelets 150 - 400 K/uL 87(L) 120(L) 109(L)    CMP Latest Ref Rng & Units 04/18/2021 04/17/2021 04/17/2021  Glucose 70 - 99 mg/dL 113(H) 122(H) 192(H)  BUN 8 - 23 mg/dL 30(H) 28(H) 25(H)  Creatinine 0.61 - 1.24 mg/dL 1.17 1.15 1.27(H)  Sodium 135 - 145 mmol/L 151(H) 154(H) 152(H)  Potassium 3.5 - 5.1 mmol/L 2.9(L) 3.4(L) 3.5  Chloride 98 - 111 mmol/L 115(H) 117(H) 112(H)  CO2 22 - 32 mmol/L 26 26 24   Calcium 8.9 - 10.3 mg/dL 8.3(L) 8.5(L) 8.9  Total Protein  6.5 - 8.1 g/dL - - 5.8(L)  Total Bilirubin 0.3 - 1.2 mg/dL - - 1.5(H)  Alkaline Phos 38 - 126 U/L - - 63  AST 15 - 41 U/L - - 23  ALT 0 - 44 U/L - - 34    Recent Labs    04/17/21 2257 04/18/21 0256 04/18/21 1137  GLUCAP 124* 104* 152*      Pertinent Imaging: DG CHEST PORT 1 VIEW  Result Date: 04/17/2021 CLINICAL DATA:  Subdural hematoma, fever EXAM: PORTABLE CHEST 1 VIEW COMPARISON:  None. FINDINGS: Patchy right lower lobe airspace disease concerning for pneumonia. No pleural effusion or pneumothorax. Heart and mediastinal contours are unremarkable. No acute osseous abnormality. IMPRESSION: 1. Patchy right  lower lobe airspace disease concerning for pneumonia. Electronically Signed   By: Kathreen Devoid M.D.   On: 04/17/2021 17:52    ASSESSMENT/PLAN:  Assessment: Principal Problem:   Subdural hematoma Active Problems:   Pressure injury of skin   ICH (intracerebral hemorrhage) (HCC)   John Gilmore is a 88 y.o. with pertinent PMH of pancreatic insufficiency, diabetes and dementia of 6 years with sharp decline in the last 2-3 who now resides  in [facility] care who presented after several falls with altered mental status and is admitted to medicine service for management of blood sugar on hospital day 1  # Altered mental status # History of dementia # Intraparenchymal bleed - frequent falls recently, but not on blood thinners. CT head obtained in ED showed multiple bilateral parenchymal hemorrhages with surrounding edema and mass effect without evidence of herniation. Frontal subdural hemorrhage also noted.   - ED discussed with neurosurgery who did not recommend any surgical intervention.  Patient exam improved this a.m., however patient still far from baseline.  Discussed with Dr. Harlow Asa, patients son, that we will continue to see if he continues to have improvement. Dr. Harlow Asa stated that if he does not make improvement to the point where he is able to feed himself independently that he would like to consider palliative care at that time.    Hypernatremia - sodium 151 today, up from yesterday. Plan to keep patient slightly hypernatremic given cerebral edema. He is still requiring fluids for his AKI and DM. Serum creatinine is downtrending. Continue half normal at 75cc per hour. These were started 1312 2/3, will f/u patient's PM BMP.  Hypokalemia: Noted to be hypokalemic to 2.9 on a.m. labs.  His mag was also 1.6.  Patient received IV potassium x6.  -Will follow up PM BMP    Diabetes mellitus type 2 - Patient was previously controlled on glipizide, Januvia, metformin, and  Jardiance. Patient only taking metformin and Jardiance once daily due to a1c in the 5's. Glucose likely elevated due to missed medication doses. - Given novolog 5 units and 500cc bolus IVFs. Patient  started on sliding scale insulin.  Now normoglycemic without insulin use.  Patient is currently n.p.o. given his likely aspiration pna and continued high risk for aspiration.   # Acute kidney injury Cr 1.38 with baseline around 1.0. Likely prerenal azotemia secondary to dehydration in the setting of poor PO intake. Poor skin turgor noted on exam and mucous membranes dry.  -Improving with IV fluids.  We will continue these for now.   Frequent urination Likely due to osmotic diuresis. - patient was not reporting any suprapubic pain or dysuria prior to presentation - UA significant for glucosuria only .   Pancreatic insufficiency - on creon at home.  Holding while hospitalized   Sacral wound No skin breakdown noted during bandaging. - continue to monitor, keep wound clean, dry, and intact. Frequent repositioning.   Hx of hypertriglyceridemia - most recent LDL appears to be 133  Diet: NPO VTE:  SCDs IVF:    Half-normal saline Code: DNR/DNI Prior to Admission Living Arrangement:  [facility] Anticipated Discharge Location: SNF Barriers to Discharge: medical stability  Dispo: Admit patient to Inpatient with expected length of stay greater than 2 midnights.  Signature: Rick Duff, MD PGY-2 Internal Medicine  Pager [phone]   Please contact the on call pager after 5 pm and on weekends at [phone].

## 2021-04-18 NOTE — Progress Notes (Signed)
Evaluated patient after it was noted that his oxygen saturation was 83, 86, and 85.  At bedside patient had oxygen on and was on 3 L, pulse ox was not on at that time.  Nursing brought in portable pulse ox and patient's O2 sats were noted to be in the low 90s.  It is unclear if O2 saturations of 83, 86, and 85 are real given that patient was on at least 2 to 3 L of oxygen at all times and the pleth during those measurements are not known. Patient's oxygen was increased to 5L and he had O2 saturations >96 with good pleth.    Patient was also pulled further up in bed with the assistance of his nurse and family.   Patient's goal O2 sats are >92%.

## 2021-04-19 DIAGNOSIS — E871 Hypo-osmolality and hyponatremia: Secondary | ICD-10-CM | POA: Diagnosis not present

## 2021-04-19 DIAGNOSIS — J9601 Acute respiratory failure with hypoxia: Secondary | ICD-10-CM | POA: Diagnosis not present

## 2021-04-19 DIAGNOSIS — S065XAA Traumatic subdural hemorrhage with loss of consciousness status unknown, initial encounter: Secondary | ICD-10-CM | POA: Diagnosis not present

## 2021-04-19 DIAGNOSIS — E119 Type 2 diabetes mellitus without complications: Secondary | ICD-10-CM | POA: Diagnosis not present

## 2021-04-19 LAB — GLUCOSE, CAPILLARY
Glucose-Capillary: 100 mg/dL — ABNORMAL HIGH (ref 70–99)
Glucose-Capillary: 130 mg/dL — ABNORMAL HIGH (ref 70–99)
Glucose-Capillary: 139 mg/dL — ABNORMAL HIGH (ref 70–99)
Glucose-Capillary: 156 mg/dL — ABNORMAL HIGH (ref 70–99)
Glucose-Capillary: 179 mg/dL — ABNORMAL HIGH (ref 70–99)
Glucose-Capillary: 95 mg/dL (ref 70–99)

## 2021-04-19 LAB — BLOOD GAS, VENOUS
Acid-base deficit: 0 mmol/L (ref 0.0–2.0)
Bicarbonate: 25.8 mmol/L (ref 20.0–28.0)
Drawn by: 4653
FIO2: 28
O2 Saturation: 79.2 %
Patient temperature: 37
pCO2, Ven: 55.3 mmHg (ref 44.0–60.0)
pH, Ven: 7.291 (ref 7.250–7.430)
pO2, Ven: 50.9 mmHg — ABNORMAL HIGH (ref 32.0–45.0)

## 2021-04-19 LAB — BASIC METABOLIC PANEL
Anion gap: 10 (ref 5–15)
Anion gap: 10 (ref 5–15)
BUN: 36 mg/dL — ABNORMAL HIGH (ref 8–23)
BUN: 34 mg/dL — ABNORMAL HIGH (ref 8–23)
CO2: 24 mmol/L (ref 22–32)
CO2: 25 mmol/L (ref 22–32)
Calcium: 8.5 mg/dL — ABNORMAL LOW (ref 8.9–10.3)
Calcium: 8.4 mg/dL — ABNORMAL LOW (ref 8.9–10.3)
Chloride: 120 mmol/L — ABNORMAL HIGH (ref 98–111)
Chloride: 119 mmol/L — ABNORMAL HIGH (ref 98–111)
Creatinine, Ser: 1.18 mg/dL (ref 0.61–1.24)
Creatinine, Ser: 1.17 mg/dL (ref 0.61–1.24)
GFR, Estimated: 59 mL/min — ABNORMAL LOW (ref >60)
GFR, Estimated: 60 mL/min — ABNORMAL LOW (ref >60)
Glucose, Bld: 149 mg/dL — ABNORMAL HIGH (ref 70–99)
Glucose, Bld: 148 mg/dL — ABNORMAL HIGH (ref 70–99)
Potassium: 3.8 mmol/L (ref 3.5–5.1)
Potassium: 3.7 mmol/L (ref 3.5–5.1)
Sodium: 154 mmol/L — ABNORMAL HIGH (ref 135–145)
Sodium: 154 mmol/L — ABNORMAL HIGH (ref 135–145)

## 2021-04-19 LAB — MAGNESIUM: Magnesium: 1.9 mg/dL (ref 1.7–2.4)

## 2021-04-19 MED ORDER — SODIUM CHLORIDE 0.9 % IV SOLN
2.0000 g | INTRAVENOUS | Status: DC
  Administered 2021-04-19 – 2021-04-21 (×2): 2 g via INTRAVENOUS
  Filled 2021-04-19 (×2): qty 20

## 2021-04-19 MED ORDER — LACTATED RINGERS IV BOLUS
500.0000 mL | Freq: Once | INTRAVENOUS | Status: AC
  Administered 2021-04-19: 500 mL via INTRAVENOUS

## 2021-04-19 MED ORDER — SODIUM CHLORIDE 0.45 % IV SOLN: INTRAVENOUS | Status: AC

## 2021-04-19 NOTE — Progress Notes (Addendum)
HD#2 SUBJECTIVE:  Patient Summary: John Gilmore is a 88 year old male with a hx of pancreatic insufficiency, diabetes and dementia of 6 years with sharp decline in the last 2-3 who now resides  in [facility] care who presented after several falls with altered mental status and is admitted to medicine service for management of elevated blood sugar and acute kidney injury.  Overnight Events:  Patient had no acute events overnight.      Interm History:   Patient opens eyes spontaneously. Able to say certain words and phrases not always appropriate for situation.   OBJECTIVE:  Vital Signs: Vitals:   04/18/21 2037 04/18/21 2320 04/19/21 0410 04/19/21 0812  BP: (!) 102/53 (!) 106/57  107/64  Pulse: 86 77 85 95  Resp: 18 15 16 20   Temp: 97.9 F (36.6 C) 98.4 F (36.9 C) 98.9 F (37.2 C) 98.1 F (36.7 C)  TempSrc: Axillary Axillary Axillary Oral  SpO2: 98% 99% 95% 98%  Weight:      Height:       Supplemental O2: Room Air SpO2: 98 % O2 Flow Rate (L/min): 2 L/min  Filed Weights   04/16/21 2002  Weight: 43.4 kg     Intake/Output Summary (Last 24 hours) at 04/19/2021 1108 Last data filed at 04/19/2021 0409 Gross per 24 hour  Intake 100 ml  Output 650 ml  Net -550 ml    Net IO Since Admission: 3,001.42 mL [04/19/21 1108]  Physical Exam: Constitutional: ill appearing male, cachectic HENT: ecchymoses of right eyelid improved Eyes: pupils fixed, pinpoint with right slightly larger than left Neck: supple Cardiovascular: regular rate and rhythm, no m/r/g Pulmonary/Chest: normal work of breathing on 2L [place], breath sounds appreciated anteriorly and no noted rales  Abdominal: soft, non-tender, non-distended MSK: decreased bulk Neurological: Opens eyes to verbal stimulation.  Pupils are fixed and pinpoint, however, right is slightly larger than left. Moves upper extremities spontaneously. Does not follow commands  Skin: warm and dry Psych: unable to  assess  Patient Lines/Drains/Airways Status     Active Line/Drains/Airways     Name Placement date Placement time Site Days   Peripheral IV 04/16/21 20 G Anterior;Distal;Upper Antecubital 04/16/21  1213  Antecubital  1   Pressure Injury 04/16/21 Sacrum Mid Stage 2 -  Partial thickness loss of dermis presenting as a shallow open injury with a red, pink wound bed without slough. pink 04/16/21  2010  -- 1            Pertinent Labs: CBC Latest Ref Rng & Units 04/18/2021 04/17/2021 04/16/2021  WBC 4.0 - 10.5 K/uL 12.3(H) 16.0(H) 11.1(H)  Hemoglobin 13.0 - 17.0 g/dL 11.2(L) 11.8(L) 12.1(L)  Hematocrit 39.0 - 52.0 % 33.6(L) 34.2(L) 35.2(L)  Platelets 150 - 400 K/uL 87(L) 120(L) 109(L)    CMP Latest Ref Rng & Units 04/19/2021 04/18/2021 04/18/2021  Glucose 70 - 99 mg/dL 149(H) 184(H) 175(H)  BUN 8 - 23 mg/dL 36(H) 38(H) 35(H)  Creatinine 0.61 - 1.24 mg/dL 1.18 1.46(H) 1.39(H)  Sodium 135 - 145 mmol/L 154(H) 151(H) 147(H)  Potassium 3.5 - 5.1 mmol/L 3.8 3.7 3.9  Chloride 98 - 111 mmol/L 120(H) 118(H) 116(H)  CO2 22 - 32 mmol/L 24 18(L) 17(L)  Calcium 8.9 - 10.3 mg/dL 8.5(L) 8.4(L) 8.1(L)  Total Protein 6.5 - 8.1 g/dL - - -  Total Bilirubin 0.3 - 1.2 mg/dL - - -  Alkaline Phos 38 - 126 U/L - - -  AST 15 - 41 U/L - - -  ALT 0 - 44 U/L - - -    Recent Labs    04/18/21 2317 04/19/21 0415 04/19/21 0818  GLUCAP 155* 95 139*      Pertinent Imaging: No results found.  ASSESSMENT/PLAN:  Assessment: Principal Problem:   Subdural hematoma Active Problems:   Pressure injury of skin   ICH (intracerebral hemorrhage) (HCC)   John Gilmore is a 88 y.o. with pertinent PMH of pancreatic insufficiency, diabetes and dementia of 6 years with sharp decline in the last 2-3 who now resides  in [facility] care who presented after several falls with altered mental status and is admitted to medicine service for management of blood sugar on hospital day 2  # Altered mental status #  History of dementia # Intraparenchymal bleed - frequent falls recently in the setting of being transferred to a memory care unit recently after his wife was required to go to ALF, but not on blood thinners. CT head obtained in ED showed multiple bilateral parenchymal hemorrhages with surrounding edema and mass effect without evidence of herniation. Frontal subdural hemorrhage also noted.   - ED discussed with neurosurgery who did not recommend any surgical intervention.  Patient exam stable this a.m., however patient still far from baseline per Dr. Harlow Asa, patient's son.  Discussed with Dr. Harlow Asa that we will continue to see if he continues to have improvement. Dr. Harlow Asa stated that if he does not make improvement to the point where he is able to feed himself independently that he would like to consider palliative care at that time.    Acute hypoxic respiratory failure 2/2 suspected aspiration pneumonia Hypoxia noted on evening of 2/3, CXR obtained showing RLL opacity concerning for pneumonia. Suspect aspiration in the setting of several days of altered mentation. Initially treated with Unasyn, will transition to CTX given lower Na load and no imaging indications seen for anaerobic coverage at this time. -CTX -Monitor O2 sats -Oral care  Hypernatremia - sodium 154 today, up from yesterday. Plan to keep patient slightly hypernatremic given cerebral edema.  Hypernatremia likely multifactorial in etiology.  He continues to get salt load from antibiotics.  In addition patient remains n.p.o. due to concern for aspiration pneumonia and persistent risk of aspiration events.  He also continues to have free water loss without replacement. Briefly placed patient on LR for about two hours this AM but will transition him back to half normal at 75cc per hour, ordered with stop time.  -F/u PM BMP  Diabetes mellitus type 2 - Patient was previously controlled on glipizide, Januvia, metformin, and Jardiance.  Patient only taking metformin and Jardiance once daily due to a1c in the 5's. Glucose likely elevated on presentation due to missed medication doses. - Patient remains n.p.o. given his likely aspiration pna and continued high risk for aspiration. His AM Bgs have ben in 140 to 180 range mostly this hospitalization.   # Acute kidney injury Cr 1.18 improved from day prior with baseline around 1.0. Likely prerenal azotemia secondary to dehydration in the setting of poor PO intake as well as several day free water losses and inability to replace these due to worsening mental status.  -Improving with IV fluids.  Will continue this for 12 hours.    #Pancreatic insufficiency - on creon at home.  Holding while hospitalized   #Sacral wound No skin breakdown noted during bandaging. - continue to monitor, keep wound clean, dry, and intact. Frequent repositioning.   #Hx of hypertriglyceridemia - most recent  LDL appears to be 133  Diet: NPO VTE:  SCDs IVF:    Half-normal saline Code: DNR/DNI Prior to Admission Living Arrangement:  [facility] Anticipated Discharge Location: SNF Barriers to Discharge: medical stability  Dispo: Admit patient to Inpatient with expected length of stay greater than 2 midnights.  Signature: Rick Duff, MD PGY-2 Internal Medicine  Pager [phone]   Please contact the on call pager after 5 pm and on weekends at [phone].

## 2021-04-19 NOTE — Evaluation (Signed)
Clinical/Bedside Swallow Evaluation Patient Details  Name: John Gilmore MRN: [id number] Date of Birth: 1931-02-10  Today's Date: 04/19/2021 Time: SLP Start Time (ACUTE ONLY): 1359 SLP Stop Time (ACUTE ONLY): 1425 SLP Time Calculation (min) (ACUTE ONLY): 26 min  Past Medical History:  Past Medical History:  Diagnosis Date   Dementia (HCC)    Diabetes mellitus without complication (HCC)    Past Surgical History: History reviewed. No pertinent surgical history. HPI:  John Gilmore is a 88 year old male who was admitted after several falls and with AMS.  CT head revealed large right temporal lobe parenchymal hemorrhage measuring at least 4.8 x 4.0 x 2.5 cm; right small frontal subdural hemorrhage measuring up to 4  mm. PMHx hx of advancing dementia with recent progression and admission to [facility]. Current dx includes acute hypoxic resp failure with suspected aspiration pna. Per pt's son, John Gilmore had no swallowing or feeding difficulty PTA.    Assessment / Plan / Recommendation  Clinical Impression  Pt demonstrates functional swallowing with adequate oral attention, visible swallow response, and no overt s/sx of aspiration. Limited oral mechanism exam due to difficulty f/c and baseline HOH. No obvious asymmetry or focal deficits. Voice was clear post-swallow; no cough.  Primary obstacle to eating is AMS with max verbal/visual cues needed to accept initial PO boluses and initiate the necessary motor sequence necessary for swallowing. Once John Gilmore was able to "prime the pump," he swallowed sips of water from a straw and consumed tspns of applesauce with no difficulty.   Recommend starting with a pureed/dysphagia 1 diet; thin liquids; give meds one at a time in applesauce.   Pt will need full supervision/assistance with feeding. He will benefit from OT/PT evaluations.  SLP will follow for safety/diet advancement. D/W pt's son, Dr. Gerrit Friends, and RN.  SLP Visit Diagnosis:  Dysphagia, unspecified (R13.10)    Aspiration Risk       Diet Recommendation     Medication Administration: Whole meds with puree    Other  Recommendations Oral Care Recommendations: Oral care BID    Recommendations for follow up therapy are one component of a multi-disciplinary discharge planning process, led by the attending physician.  Recommendations may be updated based on patient status, additional functional criteria and insurance authorization.  Follow up Recommendations Other (comment) (tba)      Assistance Recommended at Discharge    Functional Status Assessment    Frequency and Duration min 2x/week          Prognosis Prognosis for Safe Diet Advancement: Good Barriers to Reach Goals: Cognitive deficits      Swallow Study   General Date of Onset: 04/16/21 HPI: John Gilmore is a 88 year old male who was admitted after several falls and with AMS.  CT head revealed large right temporal lobe parenchymal hemorrhage measuring at least 4.8 x 4.0 x 2.5 cm; right small frontal subdural hemorrhage measuring up to 4  mm. PMHx hx of advancing dementia with recent progression and admission to [facility]. Current dx includes acute hypoxic resp failure with suspected aspiration pna. Per pt's son, John Gilmore had no swallowing or feeding difficulty PTA. Type of Study: Bedside Swallow Evaluation Previous Swallow Assessment: no Diet Prior to this Study: NPO Temperature Spikes Noted: No Respiratory Status: Nasal cannula History of Recent Intubation: No Behavior/Cognition: Alert;Cooperative;Confused Oral Cavity Assessment: Within Functional Limits;Dry Oral Care Completed by SLP: No Oral Cavity - Dentition: Adequate natural dentition Vision: Impaired for self-feeding  Self-Feeding Abilities: Total assist Patient Positioning: Upright in bed Baseline Vocal Quality: Normal Volitional Cough: Cognitively unable to elicit Volitional Swallow: Unable to elicit     Oral/Motor/Sensory Function Overall Oral Motor/Sensory Function: Other (comment) (no obvious focal deficits, asymmetry)   Ice Chips Ice chips: Within functional limits   Thin Liquid Thin Liquid: Within functional limits    Nectar Thick Nectar Thick Liquid: Not tested   Honey Thick Honey Thick Liquid: Not tested   Puree Puree: Within functional limits   Solid     Solid: Not tested      Blenda Mounts Laurice 04/19/2021,2:47 PM   Marchelle Folks L. Samson Frederic, MA CCC/SLP Acute Rehabilitation Services Office number [phone] Pager [phone]

## 2021-04-20 DIAGNOSIS — S065XAA Traumatic subdural hemorrhage with loss of consciousness status unknown, initial encounter: Secondary | ICD-10-CM | POA: Diagnosis not present

## 2021-04-20 DIAGNOSIS — E870 Hyperosmolality and hypernatremia: Secondary | ICD-10-CM | POA: Diagnosis not present

## 2021-04-20 DIAGNOSIS — N179 Acute kidney failure, unspecified: Secondary | ICD-10-CM | POA: Diagnosis not present

## 2021-04-20 DIAGNOSIS — E119 Type 2 diabetes mellitus without complications: Secondary | ICD-10-CM | POA: Diagnosis not present

## 2021-04-20 LAB — BASIC METABOLIC PANEL
Anion gap: 8 (ref 5–15)
Anion gap: 11 (ref 5–15)
Anion gap: 11 (ref 5–15)
BUN: 28 mg/dL — ABNORMAL HIGH (ref 8–23)
BUN: 32 mg/dL — ABNORMAL HIGH (ref 8–23)
BUN: 28 mg/dL — ABNORMAL HIGH (ref 8–23)
CO2: 29 mmol/L (ref 22–32)
CO2: 23 mmol/L (ref 22–32)
CO2: 25 mmol/L (ref 22–32)
Calcium: 8.4 mg/dL — ABNORMAL LOW (ref 8.9–10.3)
Calcium: 8.3 mg/dL — ABNORMAL LOW (ref 8.9–10.3)
Calcium: 8.6 mg/dL — ABNORMAL LOW (ref 8.9–10.3)
Chloride: 118 mmol/L — ABNORMAL HIGH (ref 98–111)
Chloride: 116 mmol/L — ABNORMAL HIGH (ref 98–111)
Chloride: 116 mmol/L — ABNORMAL HIGH (ref 98–111)
Creatinine, Ser: 1.06 mg/dL (ref 0.61–1.24)
Creatinine, Ser: 0.99 mg/dL (ref 0.61–1.24)
Creatinine, Ser: 1.15 mg/dL (ref 0.61–1.24)
GFR, Estimated: >60 mL/min (ref >60)
GFR, Estimated: >60 mL/min (ref >60)
GFR, Estimated: >60 mL/min (ref >60)
Glucose, Bld: 132 mg/dL — ABNORMAL HIGH (ref 70–99)
Glucose, Bld: 369 mg/dL — ABNORMAL HIGH (ref 70–99)
Glucose, Bld: 260 mg/dL — ABNORMAL HIGH (ref 70–99)
Potassium: 4 mmol/L (ref 3.5–5.1)
Potassium: 3.5 mmol/L (ref 3.5–5.1)
Potassium: 2.9 mmol/L — ABNORMAL LOW (ref 3.5–5.1)
Sodium: 155 mmol/L — ABNORMAL HIGH (ref 135–145)
Sodium: 150 mmol/L — ABNORMAL HIGH (ref 135–145)
Sodium: 152 mmol/L — ABNORMAL HIGH (ref 135–145)

## 2021-04-20 LAB — CBC
HCT: 29.9 % — ABNORMAL LOW (ref 39.0–52.0)
Hemoglobin: 10 g/dL — ABNORMAL LOW (ref 13.0–17.0)
MCH: 31.3 pg (ref 26.0–34.0)
MCHC: 33.4 g/dL (ref 30.0–36.0)
MCV: 93.4 fL (ref 80.0–100.0)
Platelets: 103 10*3/uL — ABNORMAL LOW (ref 150–400)
RBC: 3.2 MIL/uL — ABNORMAL LOW (ref 4.22–5.81)
RDW: 13 % (ref 11.5–15.5)
WBC: 8.4 10*3/uL (ref 4.0–10.5)
nRBC: 0 % (ref 0.0–0.2)

## 2021-04-20 LAB — GLUCOSE, CAPILLARY
Glucose-Capillary: 114 mg/dL — ABNORMAL HIGH (ref 70–99)
Glucose-Capillary: 140 mg/dL — ABNORMAL HIGH (ref 70–99)
Glucose-Capillary: 155 mg/dL — ABNORMAL HIGH (ref 70–99)
Glucose-Capillary: 209 mg/dL — ABNORMAL HIGH (ref 70–99)
Glucose-Capillary: 280 mg/dL — ABNORMAL HIGH (ref 70–99)
Glucose-Capillary: 352 mg/dL — ABNORMAL HIGH (ref 70–99)

## 2021-04-20 LAB — SODIUM, URINE, RANDOM: Sodium, Ur: 61 mmol/L

## 2021-04-20 LAB — OSMOLALITY: Osmolality: 334 mOsm/kg (ref 275–295)

## 2021-04-20 LAB — CREATININE, URINE, RANDOM: Creatinine, Urine: 71.74 mg/dL

## 2021-04-20 LAB — OSMOLALITY, URINE: Osmolality, Ur: 753 mOsm/kg (ref 300–900)

## 2021-04-20 MED ORDER — DEXTROSE 5 % IV SOLN: INTRAVENOUS | Status: DC

## 2021-04-20 NOTE — Evaluation (Signed)
Physical Therapy Evaluation Patient Details Name: John Gilmore MRN: [id number] DOB: 27-Mar-1930 Today's Date: 04/20/2021  History of Present Illness  88 yo male presents to [facility] on 2/2 from [facility] with fall with + head trauma. CTH shows bilat parenchymal hemorrhages with surrounding edema and mass effect, frontal subdural hemorrhage; neurosurgery recommends no surgical intervention. PMH includes dementia, DMII.  Clinical Impression   Pt presents with generalized weakness, impaired cognition with history of dementia, max difficulty performing bed mobility tasks, and decreased activity tolerance vs baseline. Pt to benefit from acute PT to address deficits. Pt requiring max +2 for bed mobility and attempted stand, unable to reach full standing secondary to poor command following and pt weakness. Pt very restless and talking tangentially throughout session, mitts donned at end of session. Unsure of level of support memory care unit can provide pt at home, will need substantially more physical assist and likely constant supervision once d/c. PT to progress mobility as tolerated, and will continue to follow acutely.         Recommendations for follow up therapy are one component of a multi-disciplinary discharge planning process, led by the attending physician.  Recommendations may be updated based on patient status, additional functional criteria and insurance authorization.  Follow Up Recommendations Skilled nursing-short term rehab (<3 hours/day) (Highest level of care being SNF. Likely return to memory care unit with increased support and [facility] services as appropriate, vs home with palliative pending family decision.)    Assistance Recommended at Discharge Frequent or constant Supervision/Assistance  Patient can return home with the following  Two people to help with bathing/dressing/bathroom;Two people to help with walking and/or transfers;Direct supervision/assist for medications  management;Direct supervision/assist for financial management;Assist for transportation    Equipment Recommendations Wheelchair (measurements PT);Wheelchair cushion (measurements PT)  Recommendations for Other Services       Functional Status Assessment Patient has had a recent decline in their functional status and/or demonstrates limited ability to make significant improvements in function in a reasonable and predictable amount of time     Precautions / Restrictions Precautions Precautions: Fall Precaution Comments: advanced dementia Restrictions Weight Bearing Restrictions: No      Mobility  Bed Mobility Overal bed mobility: Needs Assistance Bed Mobility: Supine to Sit, Sit to Supine     Supine to sit: Max assist, +2 for physical assistance, +2 for safety/equipment Sit to supine: Max assist, +2 for physical assistance, +2 for safety/equipment   General bed mobility comments: required assist for all aspects of task, initally with posterior lean upon sitting, progressed to min A for sitting balance with L/R lateral leaning intermittently    Transfers Overall transfer level: Needs assistance Equipment used: 2 person hand held assist Transfers: Sit to/from Stand Sit to Stand: Max assist, +2 safety/equipment, +2 physical assistance           General transfer comment: attempted to stand with minimal hip clearance from bed, use of bed pad and minimal to no pt involvement.    Ambulation/Gait               General Gait Details: n/a  Stairs            Wheelchair Mobility    Modified Rankin (Stroke Patients Only) Modified Rankin (Stroke Patients Only) Pre-Morbid Rankin Score: Moderately severe disability Modified Rankin: Severe disability     Balance Overall balance assessment: Needs assistance, History of Falls Sitting-balance support: Feet supported Sitting balance-Leahy Scale: Poor Sitting balance - Comments: needing min  A for sitting balance the  majority of the time Postural control: Right lateral lean, Left lateral lean, Posterior lean                                   Pertinent Vitals/Pain Pain Assessment Pain Assessment: Faces Faces Pain Scale: Hurts a little bit Pain Location: generalized wtih change in position Pain Descriptors / Indicators: Grimacing Pain Intervention(s): Limited activity within patient's tolerance, Monitored during session, Repositioned    Home Living Family/patient expects to be discharged to:: Assisted living                   Additional Comments: pt from [facility] facility, plan to d/c back to [facility], unless family decides otherwise. Consider palliative care.    Prior Function Prior Level of Function : Needs assist  Cognitive Assist : Mobility (cognitive);ADLs (cognitive)     Physical Assist : Mobility (physical);ADLs (physical)     Mobility Comments: Per limited information in chart: pt is typically amulatory, has never fallen prior to this last fall ADLs Comments: Per chart: pt typically feeds and dresses himself, requires assist for medication management     Hand Dominance   Dominant Hand: Right    Extremity/Trunk Assessment   Upper Extremity Assessment Upper Extremity Assessment: Defer to OT evaluation    Lower Extremity Assessment Lower Extremity Assessment: Generalized weakness    Cervical / Trunk Assessment Cervical / Trunk Assessment: Kyphotic  Communication   Communication: HOH  Cognition Arousal/Alertness: Awake/alert Behavior During Therapy: Restless Overall Cognitive Status: History of cognitive impairments - at baseline                                 General Comments: pt with advanced dementia, oriented to his name only. followed <10% of commands. Pleasantly confused.        General Comments General comments (skin integrity, edema, etc.): VSS on RA, removed 2L [place], RN aware. Upon arrival pt's RLE was  hanging out of th bed rail, between the two pieces of hard plastic. When moving his leg out of the hand/bed rail he superficially scrapped his skin, RN notified and applied dressing over bloddy scrape. Pt also experienced a nose bleed at the end of the session, RN notified and delivered nose drops. Safety zone completed.    Exercises     Assessment/Plan    PT Assessment Patient needs continued PT services  PT Problem List Decreased strength;Decreased mobility;Decreased safety awareness;Decreased coordination;Decreased knowledge of precautions;Decreased balance;Decreased knowledge of use of DME;Pain;Decreased cognition;Decreased activity tolerance;Decreased skin integrity       PT Treatment Interventions DME instruction;Therapeutic activities;Therapeutic exercise;Gait training;Patient/family education;Balance training;Functional mobility training;Neuromuscular re-education    PT Goals (Current goals can be found in the Care Plan section)  Acute Rehab PT Goals PT Goal Formulation: Patient unable to participate in goal setting Time For Goal Achievement: 05/04/21 Potential to Achieve Goals: Fair    Frequency Min 2X/week     Co-evaluation PT/OT/SLP Co-Evaluation/Treatment: Yes Reason for Co-Treatment: For patient/therapist safety;To address functional/ADL transfers;Necessary to address cognition/behavior during functional activity PT goals addressed during session: Balance;Mobility/safety with mobility OT goals addressed during session: ADL's and self-care       AM-PAC PT "6 Clicks" Mobility  Outcome Measure Help needed turning from your back to your side while in a flat bed without using bedrails?: A  Lot Help needed moving from lying on your back to sitting on the side of a flat bed without using bedrails?: Total Help needed moving to and from a bed to a chair (including a wheelchair)?: Total Help needed standing up from a chair using your arms (e.g., wheelchair or bedside chair)?:  Total Help needed to walk in hospital room?: Total Help needed climbing 3-5 steps with a railing? : Total 6 Click Score: 7    End of Session   Activity Tolerance: Patient limited by fatigue;Other (comment) (limited by poor command following) Patient left: in bed;with call bell/phone within reach;with bed alarm set;with restraints reapplied;Other (comment) (mitts donned, bilat rails up) Nurse Communication: Mobility status;Other (comment) (superficial scrape to R shin) PT Visit Diagnosis: Other abnormalities of gait and mobility (R26.89);Difficulty in walking, not elsewhere classified (R26.2);History of falling (Z91.81)    Time: 8299-3716 PT Time Calculation (min) (ACUTE ONLY): 26 min   Charges:   PT Evaluation $PT Eval Low Complexity: 1 Low         Katerra Ingman S, PT DPT Acute Rehabilitation Services Pager [phone]  Office [phone]   Truddie Coco 04/20/2021, 12:33 PM

## 2021-04-20 NOTE — Care Management Important Message (Signed)
Important Message  Patient Details  Name: John Gilmore MRN: [id number] Date of Birth: [birth year]   Medicare Important Message Given:  Yes     Dorena Bodo 04/20/2021, 2:07 PM

## 2021-04-20 NOTE — Evaluation (Signed)
Occupational Therapy Evaluation Patient Details Name: John Gilmore MRN: [id number] DOB: [birth year] Today's Date: 04/20/2021   History of Present Illness 88 yo male presents to [facility] on 2/2 from [facility] with fall with + head trauma. CTH shows bilat parenchymal hemorrhages with surrounding edema and mass effect, frontal subdural hemorrhage; neurosurgery recommends no surgical intervention. PMH includes dementia, DMII.   Clinical Impression   John Gilmore was evaluated for the admission list above, he was oriented to his name only, pleasantly confused and followed <10% of commands. Overall he required max-total A +2 for all mobility and ADLs due to generalized weakness, impaired cognition and poor sitting balance. Upon arrival, pts RLE was between the bed rail and hand rail, upon assisting him to remove his leg from between the rails, pt obtained a superficial scrape to his R frontal shin. RN made aware and dressed during the session. Coincidentally, pt also had a nose bleed at the end of the session. RN made aware. Attempted to sit<>stand with max A +2 with minimal hip clearance. Pt will benefit from OT acutely. Recommend d/c back to [facility], memory care with [facility] if able.      Recommendations for follow up therapy are one component of a multi-disciplinary discharge planning process, led by the attending physician.  Recommendations may be updated based on patient status, additional functional criteria and insurance authorization.   Follow Up Recommendations  Other (comment) (return to memory care facility) with [facility]   Assistance Recommended at Discharge Frequent or constant Supervision/Assistance  Patient can return home with the following A lot of help with walking and/or transfers;A lot of help with bathing/dressing/bathroom;Assistance with cooking/housework;Assistance with feeding;Direct supervision/assist for medications management;Assist for transportation;Help with stairs or  ramp for entrance    Functional Status Assessment  Patient has had a recent decline in their functional status and demonstrates the ability to make significant improvements in function in a reasonable and predictable amount of time.  Equipment Recommendations  Other (comment);Hospital bed;Wheelchair (measurements OT);BSC/3in1    Recommendations for Other Services       Precautions / Restrictions Precautions Precautions: Fall Precaution Comments: advanced dementia Restrictions Weight Bearing Restrictions: No      Mobility Bed Mobility Overal bed mobility: Needs Assistance Bed Mobility: Supine to Sit, Sit to Supine     Supine to sit: Max assist, +2 for physical assistance, +2 for safety/equipment Sit to supine: Max assist, +2 for physical assistance, +2 for safety/equipment   General bed mobility comments: required assist for all aspects of task, initally with posterior lean upon sitting, progressed to min A for sitting balance    Transfers Overall transfer level: Needs assistance Equipment used: 2 person hand held assist Transfers: Sit to/from Stand Sit to Stand: Max assist, +2 safety/equipment, +2 physical assistance           General transfer comment: attempted to stand with minimal hip clearance from bed.      Balance Overall balance assessment: Needs assistance, History of Falls Sitting-balance support: Feet supported Sitting balance-Leahy Scale: Poor Sitting balance - Comments: needing min A for sitting balance the majority of the time                                   ADL either performed or assessed with clinical judgement   ADL Overall ADL's : Needs assistance/impaired Eating/Feeding: Maximal assistance;Sitting   Grooming: Maximal assistance;Sitting   Upper Body Bathing:  Maximal assistance   Lower Body Bathing: Maximal assistance;Bed level   Upper Body Dressing : Maximal assistance;Sitting   Lower Body Dressing: Maximal  assistance;Bed level   Toilet Transfer: Total assistance Toilet Transfer Details (indicate cue type and reason): bed level Toileting- Clothing Manipulation and Hygiene: Maximal assistance;Bed level       Functional mobility during ADLs: Maximal assistance;+2 for safety/equipment;+2 for physical assistance General ADL Comments: limited by impaired cognition, generalized weakness     Vision Baseline Vision/History: 0 No visual deficits Ability to See in Adequate Light: 0 Adequate Patient Visual Report: No change from baseline Vision Assessment?: Vision impaired- to be further tested in functional context Additional Comments: difficult to assess - pt not attending to / looking towards L environment despite cues     Perception     Praxis      Pertinent Vitals/Pain Pain Assessment Pain Assessment: Faces Faces Pain Scale: Hurts a little bit Pain Location: generalized wtih change in position Pain Descriptors / Indicators: Grimacing Pain Intervention(s): Limited activity within patient's tolerance, Monitored during session     Hand Dominance     Extremity/Trunk Assessment Upper Extremity Assessment Upper Extremity Assessment: Generalized weakness;Difficult to assess due to impaired cognition (Moving BUE equally, supporting himself in sitting with both L and R arms. Shook therapist hands with stronger grip strength on the R>L)   Lower Extremity Assessment Lower Extremity Assessment: Defer to PT evaluation   Cervical / Trunk Assessment Cervical / Trunk Assessment: Kyphotic   Communication Communication Communication: HOH   Cognition Arousal/Alertness: Awake/alert Behavior During Therapy: Restless Overall Cognitive Status: History of cognitive impairments - at baseline                                 General Comments: pt with advanced dementia, oriented to his name only. followed <10% of commands. Pleasantly confused.     General Comments  VSS on RA, removed  2L [place], RN aware. Upon arrival pt's RLE was hanging out of th bed rail, between the two pieces of hard plastic. When moving his leg out of the hand/bed rail he superficially scrapped his skin, RN notified and applied dressing over bloddy scrape. Pt also experienced a nose bleed at the end of the session, RN notified and delivered nose drops. Safety zone completed.    Exercises     Shoulder Instructions      Home Living Family/patient expects to be discharged to:: Assisted living                                 Additional Comments: pt from [facility] facility, plan to d/c back to [facility], unless family decides otherwise. Consider palliative care.      Prior Functioning/Environment Prior Level of Function : Needs assist  Cognitive Assist : Mobility (cognitive);ADLs (cognitive)     Physical Assist : Mobility (physical);ADLs (physical)     Mobility Comments: Per limited information in chart: pt is typically amulatory, has never fallen prior to this last fall ADLs Comments: Per chart: pt typically feeds and dresses himself, requires assist for medication management        OT Problem List: Decreased strength;Decreased activity tolerance;Decreased range of motion;Impaired balance (sitting and/or standing);Decreased cognition;Decreased safety awareness;Decreased knowledge of use of DME or AE;Decreased knowledge of precautions      OT Treatment/Interventions: Self-care/ADL training;Therapeutic exercise;Balance training;Patient/family education;Therapeutic  activities;DME and/or AE instruction    OT Goals(Current goals can be found in the care plan section) Acute Rehab OT Goals Patient Stated Goal: unable to state OT Goal Formulation: With patient Time For Goal Achievement: 05/04/21 Potential to Achieve Goals: Good ADL Goals Pt Will Perform Eating: with set-up;sitting Pt Will Perform Grooming: with set-up;sitting Pt Will Perform Upper Body Dressing: with  set-up;sitting Pt Will Perform Lower Body Dressing: with min assist;sit to/from stand Pt Will Transfer to Toilet: with min assist;stand pivot transfer;bedside commode Additional ADL Goal #1: Pt will indep complete bed mobility as a precursor to ADLs  OT Frequency: Min 2X/week    Co-evaluation PT/OT/SLP Co-Evaluation/Treatment: Yes Reason for Co-Treatment: Complexity of the patient's impairments (multi-system involvement);Necessary to address cognition/behavior during functional activity;For patient/therapist safety;To address functional/ADL transfers   OT goals addressed during session: ADL's and self-care      AM-PAC OT "6 Clicks" Daily Activity     Outcome Measure Help from another person eating meals?: A Lot Help from another person taking care of personal grooming?: A Lot Help from another person toileting, which includes using toliet, bedpan, or urinal?: Total Help from another person bathing (including washing, rinsing, drying)?: A Lot Help from another person to put on and taking off regular upper body clothing?: A Lot Help from another person to put on and taking off regular lower body clothing?: A Lot 6 Click Score: 11   End of Session Nurse Communication: Mobility status (RLE skin tear and nose bleed)  Activity Tolerance: Patient tolerated treatment well Patient left: in bed;with call bell/phone within reach;with bed alarm set;with restraints reapplied (all four rails returned)  OT Visit Diagnosis: Unsteadiness on feet (R26.81);Other abnormalities of gait and mobility (R26.89);Muscle weakness (generalized) (M62.81);History of falling (Z91.81);Other symptoms and signs involving cognitive function                Time: 0623-7628 OT Time Calculation (min): 30 min Charges:  OT General Charges $OT Visit: 1 Visit OT Evaluation $OT Eval Moderate Complexity: 1 Mod   Ignace Mandigo A Jaquel Coomer 04/20/2021, 10:22 AM

## 2021-04-20 NOTE — Progress Notes (Addendum)
Speech Language Pathology Treatment: Dysphagia  Patient Details Name: Jakevious Hollister MRN: [id number] DOB: [birth year] Today's Date: 04/20/2021 Time: 2637-8588 SLP Time Calculation (min) (ACUTE ONLY): 22 min  Assessment / Plan / Recommendation Clinical Impression  Pt was alone in room, alert, reaching for items in air with mitts on. He allowed limited oral care prior to thin water initially pipped in with straw due to cognitive difficulties using straw. He demonstrated straw consumption of thin periodically needed additional time and cues. There were indications of decreased airway protection with coughing and throat clearing on thin in addition to suspected pharyngeal residue with multiple audible swallows of applesauce. Intermittent aspiration suspected and not uncommon for pt's with SDH in addition to dementia. Ordered nectar thick liquids, continue puree. Will attempt to discuss results with family. Pt needs 1:1 feeding and supervision assist. May need teaspoon of nectar if unable to use straw. Will continue to treat.   Addendum: Attempted to call Dr. Gerrit Friends with result's of today's session. "Number cannot be completed as dialed".    HPI HPI: Mr. Lucus is a 88 year old male who was admitted after several falls and with AMS.  CT head revealed large right temporal lobe parenchymal hemorrhage measuring at least 4.8 x 4.0 x 2.5 cm; right small frontal subdural hemorrhage measuring up to 4  mm. PMHx hx of advancing dementia with recent progression and admission to [facility]. Current dx includes acute hypoxic resp failure with suspected aspiration pna. Per pt's son, Mr. Fidalgo had no swallowing or feeding difficulty PTA.      SLP Plan  Continue with current plan of care      Recommendations for follow up therapy are one component of a multi-disciplinary discharge planning process, led by the attending physician.  Recommendations may be updated based on patient status, additional  functional criteria and insurance authorization.    Recommendations  Diet recommendations: Dysphagia 1 (puree);Nectar-thick liquid Liquids provided via: Straw Medication Administration: Crushed with puree Supervision: Staff to assist with self feeding;Full supervision/cueing for compensatory strategies Compensations: Minimize environmental distractions;Slow rate;Small sips/bites Postural Changes and/or Swallow Maneuvers: Seated upright 90 degrees                Oral Care Recommendations: Oral care BID Follow Up Recommendations:  (TBA) SLP Visit Diagnosis: Dysphagia, unspecified (R13.10) Plan: Continue with current plan of care           Royce Macadamia  04/20/2021, 2:24 PM  Breck Coons Lonell Face.Ed Nurse, children's [phone] Office [phone]

## 2021-04-20 NOTE — Progress Notes (Signed)
Critical lab value:  Serum Osmo 334  Page MD at 09:41, awaiting orders

## 2021-04-20 NOTE — Progress Notes (Addendum)
HD#3 SUBJECTIVE:  Patient Summary: John Gilmore is a 88 year old male with a hx of pancreatic insufficiency, diabetes and dementia of 6 years with sharp decline in the last 2-3 who now resides  in [facility] who presented after several falls with altered mental status and is admitted to medicine service for management of elevated blood sugar and acute kidney injury.  Overnight Events:  Patient with a skin tear over his right shin after getting his leg stuck in bed railing     Interm History:   Seen and evaluated at bedside. Patient awake and responds to questions. Intermittently responds to questioning, able to answer name. Does not report pain.     OBJECTIVE:  Vital Signs: Vitals:   04/20/21 0800 04/20/21 0828 04/20/21 1100 04/20/21 1200  BP:  124/73 138/63   Pulse:  90 87   Resp:  16 17   Temp:  99.8 F (37.7 C) 100 F (37.8 C)   TempSrc:  Axillary Axillary   SpO2: 97% 96% 96% 96%  Weight:      Height:       Supplemental O2: Room Air SpO2: 96 % O2 Flow Rate (L/min): 2 L/min FiO2 (%): 21 %  Filed Weights   04/16/21 2002  Weight: 43.4 kg     Intake/Output Summary (Last 24 hours) at 04/20/2021 1351 Last data filed at 04/20/2021 0800 Gross per 24 hour  Intake 1291.82 ml  Output 1075 ml  Net 216.82 ml   Net IO Since Admission: 3,218.24 mL [04/20/21 1351]  Physical Exam: Constitutional: chronically ill appearing male, cachectic HENT: ecchymoses of right eyelid improved Eyes: pupils fixed, pinpoint with right slightly larger than left Neck: supple Cardiovascular: regular rate and rhythm, no m/r/g Pulmonary/Chest: normal work of breathing on 2L [place], breath sounds appreciated anteriorly and no noted rales  Abdominal: soft, non-tender, non-distended MSK: decreased bulk Neurological: Alert, oriented to self, not place or time. Responds to verbal and tactile stimuli. Responses not always appropriate.  Skin: warm and dry, skin tear noted over right  shin with some bleeding at site Psych: unable to assess  Patient Lines/Drains/Airways Status     Active Line/Drains/Airways     Name Placement date Placement time Site Days   Peripheral IV 04/16/21 20 G Anterior;Distal;Upper Antecubital 04/16/21  1213  Antecubital  1   Pressure Injury 04/16/21 Sacrum Mid Stage 2 -  Partial thickness loss of dermis presenting as a shallow open injury with a red, pink wound bed without slough. pink 04/16/21  2010  -- 1            Pertinent Labs: CBC Latest Ref Rng & Units 04/20/2021 04/18/2021 04/17/2021  WBC 4.0 - 10.5 K/uL 8.4 12.3(H) 16.0(H)  Hemoglobin 13.0 - 17.0 g/dL 10.0(L) 11.2(L) 11.8(L)  Hematocrit 39.0 - 52.0 % 29.9(L) 33.6(L) 34.2(L)  Platelets 150 - 400 K/uL 103(L) 87(L) 120(L)    CMP Latest Ref Rng & Units 04/20/2021 04/19/2021 04/19/2021  Glucose 70 - 99 mg/dL 132(H) 148(H) 149(H)  BUN 8 - 23 mg/dL 28(H) 34(H) 36(H)  Creatinine 0.61 - 1.24 mg/dL 1.06 1.17 1.18  Sodium 135 - 145 mmol/L 155(H) 154(H) 154(H)  Potassium 3.5 - 5.1 mmol/L 4.0 3.7 3.8  Chloride 98 - 111 mmol/L 118(H) 119(H) 120(H)  CO2 22 - 32 mmol/L 29 25 24   Calcium 8.9 - 10.3 mg/dL 8.4(L) 8.4(L) 8.5(L)  Total Protein 6.5 - 8.1 g/dL - - -  Total Bilirubin 0.3 - 1.2 mg/dL - - -  Alkaline Phos 38 - 126 U/L - - -  AST 15 - 41 U/L - - -  ALT 0 - 44 U/L - - -    Recent Labs    04/20/21 0343 04/20/21 0835 04/20/21 1111  GLUCAP 155* 114* 140*     Pertinent Imaging: No results found.  ASSESSMENT/PLAN:  Assessment: Principal Problem:   Subdural hematoma Active Problems:   Pressure injury of skin   ICH (intracerebral hemorrhage) (HCC)   John Gilmore is a 88 y.o. with pertinent PMH of pancreatic insufficiency, diabetes and dementia of 6 years with sharp decline in the last 2-3 who now resides  in [facility] who presented after several falls with altered mental status and is admitted to medicine service for management of blood sugar on hospital day  3  # Altered mental status # History of dementia # Intraparenchymal bleed  - No surgical intervention.   - Discussed with Dr. Harlow Asa (son) that we will continue to see if he continues to have improvement.  On exam today, he has shown dramatic improvement since admission. He is now awake and alert. Responding to verbal stimuli, though still only oriented to person. He does have a history of underlying dementia, will need to discuss with Dr. Harlow Asa how close to baseline he is.  - He was evaluated by SLP and was able to eat some food with prompting.  Dr. Harlow Asa stated that if he does not make improvement to the point where he is able to feed himself independently that he would like to consider palliative care at that time.  Acute hypoxic respiratory failure 2/2 suspected aspiration pneumonia Hypoxia noted on evening of 2/3, CXR obtained showing RLL opacity concerning for pneumonia. Suspect aspiration in the setting of several days of altered mentation. Initially treated with Unasyn, will transition to CTX given lower Na load and no imaging indications seen for anaerobic coverage at this time. -CTX -Monitor O2 sats -Oral care  Hypernatremia - sodium continues to trend up. 155 today. Serum Osms elevated 334. Urine studies unremarkable.  - D5W at 75cc/hr  Diabetes mellitus type 2 - Patient was previously controlled on glipizide, Januvia, metformin, and Jardiance. Patient only taking metformin and Jardiance once daily due to a1c in the 5's. Glucose likely elevated on presentation due to missed medication doses. - Patient remains n.p.o. given his likely aspiration pna and continued high risk for aspiration. His AM Bgs have ben in 140 to 180 range mostly this hospitalization.   # Acute kidney injury Cr 1.18 improved from day prior with baseline around 1.0. Likely prerenal azotemia secondary to dehydration in the setting of poor PO intake as well as several day free water losses and inability to  replace these due to worsening mental status.  -Improving with IV fluids.    #Pancreatic insufficiency - on creon at home.  Holding while hospitalized   #Sacral wound No skin breakdown noted during bandaging. - continue to monitor, keep wound clean, dry, and intact. Frequent repositioning.   #Hx of hypertriglyceridemia - most recent LDL appears to be 133  Diet: NPO VTE:  SCDs IVF:    Half-normal saline Code: DNR/DNI Prior to Admission Living Arrangement:  [facility] Anticipated Discharge Location: SNF Barriers to Discharge: medical stability  Dispo: Admit patient to Inpatient with expected length of stay greater than 2 midnights.  Signature: Delene Ruffini, PGY1 Internal Medicine  Pager [phone]   Please contact the on call pager after 5 pm and  on weekends at [phone].

## 2021-04-21 DIAGNOSIS — E119 Type 2 diabetes mellitus without complications: Secondary | ICD-10-CM | POA: Diagnosis not present

## 2021-04-21 DIAGNOSIS — S065XAA Traumatic subdural hemorrhage with loss of consciousness status unknown, initial encounter: Secondary | ICD-10-CM | POA: Diagnosis not present

## 2021-04-21 DIAGNOSIS — E870 Hyperosmolality and hypernatremia: Secondary | ICD-10-CM | POA: Diagnosis not present

## 2021-04-21 DIAGNOSIS — N179 Acute kidney failure, unspecified: Secondary | ICD-10-CM | POA: Diagnosis not present

## 2021-04-21 LAB — BASIC METABOLIC PANEL
Anion gap: 8 (ref 5–15)
Anion gap: 7 (ref 5–15)
Anion gap: 7 (ref 5–15)
BUN: 27 mg/dL — ABNORMAL HIGH (ref 8–23)
BUN: 20 mg/dL (ref 8–23)
BUN: 17 mg/dL (ref 8–23)
CO2: 28 mmol/L (ref 22–32)
CO2: 28 mmol/L (ref 22–32)
CO2: 26 mmol/L (ref 22–32)
Calcium: 8.5 mg/dL — ABNORMAL LOW (ref 8.9–10.3)
Calcium: 8.4 mg/dL — ABNORMAL LOW (ref 8.9–10.3)
Calcium: 8.3 mg/dL — ABNORMAL LOW (ref 8.9–10.3)
Chloride: 116 mmol/L — ABNORMAL HIGH (ref 98–111)
Chloride: 114 mmol/L — ABNORMAL HIGH (ref 98–111)
Chloride: 114 mmol/L — ABNORMAL HIGH (ref 98–111)
Creatinine, Ser: 1.02 mg/dL (ref 0.61–1.24)
Creatinine, Ser: 0.98 mg/dL (ref 0.61–1.24)
Creatinine, Ser: 0.96 mg/dL (ref 0.61–1.24)
GFR, Estimated: >60 mL/min (ref >60)
GFR, Estimated: >60 mL/min (ref >60)
GFR, Estimated: >60 mL/min (ref >60)
Glucose, Bld: 118 mg/dL — ABNORMAL HIGH (ref 70–99)
Glucose, Bld: 292 mg/dL — ABNORMAL HIGH (ref 70–99)
Glucose, Bld: 246 mg/dL — ABNORMAL HIGH (ref 70–99)
Potassium: 3 mmol/L — ABNORMAL LOW (ref 3.5–5.1)
Potassium: 4.3 mmol/L (ref 3.5–5.1)
Potassium: 3.8 mmol/L (ref 3.5–5.1)
Sodium: 152 mmol/L — ABNORMAL HIGH (ref 135–145)
Sodium: 149 mmol/L — ABNORMAL HIGH (ref 135–145)
Sodium: 147 mmol/L — ABNORMAL HIGH (ref 135–145)

## 2021-04-21 LAB — UREA NITROGEN, URINE: Urea Nitrogen, Ur: 1244 mg/dL

## 2021-04-21 LAB — GLUCOSE, CAPILLARY
Glucose-Capillary: 101 mg/dL — ABNORMAL HIGH (ref 70–99)
Glucose-Capillary: 173 mg/dL — ABNORMAL HIGH (ref 70–99)
Glucose-Capillary: 215 mg/dL — ABNORMAL HIGH (ref 70–99)
Glucose-Capillary: 243 mg/dL — ABNORMAL HIGH (ref 70–99)
Glucose-Capillary: 245 mg/dL — ABNORMAL HIGH (ref 70–99)
Glucose-Capillary: 300 mg/dL — ABNORMAL HIGH (ref 70–99)

## 2021-04-21 LAB — CBC
HCT: 31.8 % — ABNORMAL LOW (ref 39.0–52.0)
Hemoglobin: 10.5 g/dL — ABNORMAL LOW (ref 13.0–17.0)
MCH: 30.9 pg (ref 26.0–34.0)
MCHC: 33 g/dL (ref 30.0–36.0)
MCV: 93.5 fL (ref 80.0–100.0)
Platelets: 107 10*3/uL — ABNORMAL LOW (ref 150–400)
RBC: 3.4 MIL/uL — ABNORMAL LOW (ref 4.22–5.81)
RDW: 13 % (ref 11.5–15.5)
WBC: 7.9 10*3/uL (ref 4.0–10.5)
nRBC: 0 % (ref 0.0–0.2)

## 2021-04-21 MED ORDER — PANCRELIPASE (LIP-PROT-AMYL) 12000-38000 UNITS PO CPEP
12000.0000 [IU] | ORAL_CAPSULE | Freq: Three times a day (TID) | ORAL | Status: DC
  Administered 2021-04-21 – 2021-04-28 (×21): 12000 [IU] via ORAL
  Filled 2021-04-21 (×22): qty 1

## 2021-04-21 MED ORDER — INSULIN ASPART 100 UNIT/ML IJ SOLN
0.0000 [IU] | Freq: Three times a day (TID) | INTRAMUSCULAR | Status: DC
  Administered 2021-04-21: 5 [IU] via SUBCUTANEOUS
  Administered 2021-04-22: 7 [IU] via SUBCUTANEOUS
  Administered 2021-04-22: 2 [IU] via SUBCUTANEOUS
  Administered 2021-04-22 – 2021-04-23 (×3): 3 [IU] via SUBCUTANEOUS
  Administered 2021-04-23: 7 [IU] via SUBCUTANEOUS
  Administered 2021-04-24 (×2): 2 [IU] via SUBCUTANEOUS
  Administered 2021-04-24: 3 [IU] via SUBCUTANEOUS
  Administered 2021-04-25: 2 [IU] via SUBCUTANEOUS
  Administered 2021-04-25: 9 [IU] via SUBCUTANEOUS
  Administered 2021-04-26 (×2): 2 [IU] via SUBCUTANEOUS
  Administered 2021-04-27: 1 [IU] via SUBCUTANEOUS
  Administered 2021-04-28: 3 [IU] via SUBCUTANEOUS

## 2021-04-21 MED ORDER — INSULIN ASPART 100 UNIT/ML IJ SOLN: 0.0000 [IU] | Freq: Three times a day (TID) | INTRAMUSCULAR | Status: DC

## 2021-04-21 MED ORDER — INSULIN ASPART 100 UNIT/ML IJ SOLN: 0.0000 [IU] | Freq: Every day | INTRAMUSCULAR | Status: DC

## 2021-04-21 MED ORDER — POTASSIUM CHLORIDE 20 MEQ PO PACK
40.0000 meq | PACK | ORAL | Status: AC
  Administered 2021-04-21 (×2): 40 meq via ORAL
  Filled 2021-04-21 (×2): qty 2

## 2021-04-21 NOTE — Progress Notes (Signed)
HD#4 SUBJECTIVE:  Patient Summary: John Gilmore is a 88 year old male with a hx of pancreatic insufficiency, diabetes and dementia of 6 years with sharp decline in the last 2-3 who now resides  in [facility] who presented after several falls with altered mental status and is admitted to medicine service for management of elevated blood sugar and acute kidney injury.  Overnight Events:  No acute events overnight.   Interm History:  Patient seen and evaluated at bedside. Patient more somnolent today compared to yesterday. He slept through most of exam with minimal participation. Per son, Dr. Harlow Asa, patient will wake up to eat and occasionally verbalizes. Dr. Harlow Asa states his brother is coming into town Thursday.     OBJECTIVE:  Vital Signs: Vitals:   04/21/21 0413 04/21/21 0800 04/21/21 0828 04/21/21 1228  BP: 119/83  132/64 122/85  Pulse: 76  88 81  Resp: 18  20 20   Temp: 98.3 F (36.8 C)  99.3 F (37.4 C) 99.9 F (37.7 C)  TempSrc: Axillary  Axillary Axillary  SpO2: 93% 98% 100% 100%  Weight:      Height:       Supplemental O2: Room Air SpO2: 100 % O2 Flow Rate (L/min): 2 L/min FiO2 (%): 21 %  Filed Weights   04/16/21 2002  Weight: 43.4 kg     Intake/Output Summary (Last 24 hours) at 04/21/2021 1507 Last data filed at 04/21/2021 0600 Gross per 24 hour  Intake 408.75 ml  Output 400 ml  Net 8.75 ml   Net IO Since Admission: 3,243.99 mL [04/21/21 1507]  Physical Exam: Constitutional: chronically ill appearing male, cachectic, somnolent HENT: ecchymoses of right eyelid improved Neck: supple Cardiovascular: regular rate and rhythm, no m/r/g Pulmonary/Chest: normal work of breathing on 2L [place], breath sounds appreciated anteriorly and no noted rales  Abdominal: soft, non-tender, non-distended MSK: decreased bulk Neurological: Somnolent, does not participate in exam today  Skin: warm and dry, skin tear noted over right shin with some bleeding at  site Psych: unable to assess  Patient Lines/Drains/Airways Status     Active Line/Drains/Airways     Name Placement date Placement time Site Days   Peripheral IV 04/16/21 20 G Anterior;Distal;Upper Antecubital 04/16/21  1213  Antecubital  1   Pressure Injury 04/16/21 Sacrum Mid Stage 2 -  Partial thickness loss of dermis presenting as a shallow open injury with a red, pink wound bed without slough. pink 04/16/21  2010  -- 1            Pertinent Labs: CBC Latest Ref Rng & Units 04/21/2021 04/20/2021 04/18/2021  WBC 4.0 - 10.5 K/uL 7.9 8.4 12.3(H)  Hemoglobin 13.0 - 17.0 g/dL 10.5(L) 10.0(L) 11.2(L)  Hematocrit 39.0 - 52.0 % 31.8(L) 29.9(L) 33.6(L)  Platelets 150 - 400 K/uL 107(L) 103(L) 87(L)    CMP Latest Ref Rng & Units 04/21/2021 04/20/2021 04/20/2021  Glucose 70 - 99 mg/dL 118(H) 260(H) 369(H)  BUN 8 - 23 mg/dL 27(H) 28(H) 32(H)  Creatinine 0.61 - 1.24 mg/dL 1.02 1.15 0.99  Sodium 135 - 145 mmol/L 152(H) 152(H) 150(H)  Potassium 3.5 - 5.1 mmol/L 3.0(L) 2.9(L) 3.5  Chloride 98 - 111 mmol/L 116(H) 116(H) 116(H)  CO2 22 - 32 mmol/L 28 25 23   Calcium 8.9 - 10.3 mg/dL 8.5(L) 8.6(L) 8.3(L)  Total Protein 6.5 - 8.1 g/dL - - -  Total Bilirubin 0.3 - 1.2 mg/dL - - -  Alkaline Phos 38 - 126 U/L - - -  AST 15 - 41 U/L - - -  ALT 0 - 44 U/L - - -    Recent Labs    04/21/21 0412 04/21/21 0826 04/21/21 1226  GLUCAP 101* 173* 215*     Pertinent Imaging: No results found.  ASSESSMENT/PLAN:  Assessment: Principal Problem:   Subdural hematoma Active Problems:   Pressure injury of skin   ICH (intracerebral hemorrhage) (HCC)   John Gilmore is a 88 y.o. with pertinent PMH of pancreatic insufficiency, diabetes and dementia of 6 years with sharp decline in the last 2-3 who now resides  in [facility] who presented after several falls with altered mental status and is admitted to medicine service for management of blood sugar on hospital day 4  # Altered mental  status # History of dementia # Intraparenchymal bleed  - No surgical intervention.  - Patient is more somnolent today compared to prior.  - Plan initially was to monitor for improvement and observe to see if patient was able to feed himself. Although patient did demonstrate some improvement in mental status, he has not been able to feed himself, remains rather somnolent, and speech is largely incoherent. He has been taking some food by mouth, however, only eating approximately 25% of meals. Feeding unlikely to show much improvement. Family not wanting to pursue long-term alternate means of feeding. Patient's other son coming into town Thursday. A palliative care consult has been placed with the hope of being able to have a family meeting Friday to discuss [facility].    Acute hypoxic respiratory failure 2/2 suspected aspiration pneumonia Initial concern for aspiration pneumonia, treated with course of ceftriaxone. He has been afebrile, leukocytosis returned to normal. Satting well.  - Pt completed 5 day course of ceftriaxone. Discontinue abx. Continue to monitor.    Hypernatremia - Sodium 149, corrects to 152 given sugar. F/u repeat BMP - D5W at 75cc/hr  Diabetes mellitus type 2 - Patient was previously controlled on glipizide, Januvia, metformin, and Jardiance. Patient only taking metformin and Jardiance once daily due to a1c in the 5's.  - receiving D5w.  - Initially on SSI q4 given NPO status. He has since been cleared for dysphagia 1 diet. Starting on SSI with meals.  # Acute kidney injury Cr 1.18 improved from day prior with baseline around 1.0. Likely prerenal azotemia secondary to dehydration in the setting of poor PO intake as well as several day free water losses and inability to replace these due to worsening mental status.  -Improving with IV fluids.    #Pancreatic insufficiency - on creon at home.  Restarted with meals.    #Sacral wound No skin breakdown noted during bandaging. -  continue to monitor, keep wound clean, dry, and intact. Frequent repositioning.   #Hx of hypertriglyceridemia - most recent LDL appears to be 133  Diet: NPO VTE:  SCDs IVF:    Half-normal saline Code: DNR/DNI Prior to Admission Living Arrangement:  [facility] Anticipated Discharge Location: SNF Barriers to Discharge: medical stability  Dispo: Admit patient to Inpatient with expected length of stay greater than 2 midnights.  Signature: Delene Ruffini, PGY1 Internal Medicine  Pager [phone]   Please contact the on call pager after 5 pm and on weekends at [phone].

## 2021-04-21 NOTE — Plan of Care (Signed)
Pt is alert, no distress noted. Pt has been repositioned in bed. Pt is on 4L nasal cannula. Pt has condom cath in place for urine output. Oral care completed, mouth suctioned. Pt has Rhonchi lung sounds, pt has congested cough. Pt is on continuous pulse ox.   Problem: Education: Goal: Knowledge of General Education information will improve Description: Including pain rating scale, medication(s)/side effects and non-pharmacologic comfort measures Outcome: Progressing   Problem: Health Behavior/Discharge Planning: Goal: Ability to manage health-related needs will improve Outcome: Progressing   Problem: Clinical Measurements: Goal: Ability to maintain clinical measurements within normal limits will improve Outcome: Progressing Goal: Will remain free from infection Outcome: Progressing Goal: Diagnostic test results will improve Outcome: Progressing Goal: Respiratory complications will improve Outcome: Progressing Goal: Cardiovascular complication will be avoided Outcome: Progressing   Problem: Activity: Goal: Risk for activity intolerance will decrease Outcome: Progressing   Problem: Nutrition: Goal: Adequate nutrition will be maintained Outcome: Progressing   Problem: Coping: Goal: Level of anxiety will decrease Outcome: Progressing   Problem: Elimination: Goal: Will not experience complications related to bowel motility Outcome: Progressing Goal: Will not experience complications related to urinary retention Outcome: Progressing   Problem: Pain Managment: Goal: General experience of comfort will improve Outcome: Progressing   Problem: Safety: Goal: Ability to remain free from injury will improve Outcome: Progressing   Problem: Skin Integrity: Goal: Risk for impaired skin integrity will decrease Outcome: Progressing

## 2021-04-21 NOTE — Progress Notes (Signed)
Speech Language Pathology Treatment: Dysphagia  Patient Details Name: John Gilmore MRN: [id number] DOB: April 12, 1930 Today's Date: 04/21/2021 Time: 7846-9629 SLP Time Calculation (min) (ACUTE ONLY): 15 min  Assessment / Plan / Recommendation Clinical Impression  Therapist provided update and education to Dr. Gerrit Friends, pt's son on today and yesterday's sessions. He verbalized family is not wanting to pursue long term alternate means of feeding and understands his overall status in particular swallowing is unlikely to improve. Discussed probability of intermittent aspiration based on therapists's observations. He is in agreement with current texture of puree and nectar thick liquids. Will continue to facilitate po consumption and safety.    HPI HPI: John Gilmore is a 88 year old male who was admitted after several falls and with AMS.  CT head revealed large right temporal lobe parenchymal hemorrhage measuring at least 4.8 x 4.0 x 2.5 cm; right small frontal subdural hemorrhage measuring up to 4  mm. PMHx hx of advancing dementia with recent progression and admission to [facility]. Current dx includes acute hypoxic resp failure with suspected aspiration pna. Per pt's son, John Gilmore had no swallowing or feeding difficulty PTA.      SLP Plan  Continue with current plan of care      Recommendations for follow up therapy are one component of a multi-disciplinary discharge planning process, led by the attending physician.  Recommendations may be updated based on patient status, additional functional criteria and insurance authorization.    Recommendations  Diet recommendations: Dysphagia 1 (puree);Nectar-thick liquid Liquids provided via: Cup;Straw;Teaspoon Medication Administration: Crushed with puree Supervision: Staff to assist with self feeding;Full supervision/cueing for compensatory strategies Compensations: Minimize environmental distractions;Slow rate;Small  sips/bites Postural Changes and/or Swallow Maneuvers: Seated upright 90 degrees                Oral Care Recommendations: Oral care BID Follow Up Recommendations: Skilled nursing-short term rehab (<3 hours/day) Assistance recommended at discharge: Frequent or constant Supervision/Assistance SLP Visit Diagnosis: Dysphagia, unspecified (R13.10) Plan: Continue with current plan of care           Royce Macadamia  04/21/2021, 2:38 PM.Cobi Aldape Myrtie Neither M.Ed Nurse, children's [phone] Office [phone]

## 2021-04-21 NOTE — Plan of Care (Signed)
Pt is alert, swinging arms at staff when attempting to provide oral care or reposition. Pt is on nasal cannula. Pt noted pulling this off multiple times. Pt has condom cath in place. IV fluids continued order.    Problem: Education: Goal: Knowledge of General Education information will improve Description: Including pain rating scale, medication(s)/side effects and non-pharmacologic comfort measures Outcome: Progressing   Problem: Health Behavior/Discharge Planning: Goal: Ability to manage health-related needs will improve Outcome: Progressing   Problem: Clinical Measurements: Goal: Ability to maintain clinical measurements within normal limits will improve Outcome: Progressing Goal: Will remain free from infection Outcome: Progressing Goal: Diagnostic test results will improve Outcome: Progressing Goal: Respiratory complications will improve Outcome: Progressing Goal: Cardiovascular complication will be avoided Outcome: Progressing   Problem: Activity: Goal: Risk for activity intolerance will decrease Outcome: Progressing   Problem: Nutrition: Goal: Adequate nutrition will be maintained Outcome: Progressing   Problem: Coping: Goal: Level of anxiety will decrease Outcome: Progressing   Problem: Elimination: Goal: Will not experience complications related to bowel motility Outcome: Progressing Goal: Will not experience complications related to urinary retention Outcome: Progressing   Problem: Pain Managment: Goal: General experience of comfort will improve Outcome: Progressing   Problem: Safety: Goal: Ability to remain free from injury will improve Outcome: Progressing   Problem: Skin Integrity: Goal: Risk for impaired skin integrity will decrease Outcome: Progressing

## 2021-04-21 NOTE — Progress Notes (Signed)
Speech Language Pathology Treatment: Dysphagia  Patient Details Name: John Gilmore MRN: [id number] DOB: [birth year] Today's Date: 04/21/2021 Time: 2595-6387 SLP Time Calculation (min) (ACUTE ONLY): 24 min  Assessment / Plan / Recommendation Clinical Impression  Mr. John Gilmore was seen at bedside for dysphagia treatment. Patient showing improved attention during treatment session compared to the last targeted session (04/20/2021). Patient was presented with Dys 1 puree tray and mildly thickened liquids. Pt benefited from increased vocal intensity and adequate processing time in order to fulfill tasks across novel mealtime (I.e., accept bites/sips). Nurse noted that at breakfast pt was biting utensils; this was not observed during session, but it is noted that the pt exhibited biting behaviors on a straw. SLP branched up to open cup use which had a favorable impact on patient's acceptance and intake of NTL. Pt would also benefit from using straw end to siphon liquids into oral cavity if unable to facilitate oral acceptance. In regards to eating, pt consumed puree meats to improve caloric intake with max assist. Pt demonstrated immediate and delayed throat clearing and coughing across meal. Decreased coughing from last targeted session.  He benefited from verbal cues to swallow again in order to clear oropharyngeal structures. Mild oral holding noted with adequate BOT lateralization and retraction. Suspected to be primarily pharyngeal dysphagia. SLP to continue to follow during acute care stay to monitor texture tolerance. Continue with current plan of care. Will speak with patient's son.   HPI HPI: Mr. John Gilmore is a 88 year old male who was admitted after several falls and with AMS.  CT head revealed large right temporal lobe parenchymal hemorrhage measuring at least 4.8 x 4.0 x 2.5 cm; right small frontal subdural hemorrhage measuring up to 4  mm. PMHx hx of advancing dementia with recent progression and  admission to [facility]. Current dx includes acute hypoxic resp failure with suspected aspiration pna. Per pt's son, Mr. John Gilmore had no swallowing or feeding difficulty PTA.      SLP Plan  Continue with current plan of care      Recommendations for follow up therapy are one component of a multi-disciplinary discharge planning process, led by the attending physician.  Recommendations may be updated based on patient status, additional functional criteria and insurance authorization.    Recommendations  Diet recommendations: Dysphagia 1 (puree);Nectar-thick liquid Liquids provided via: Cup;Straw Medication Administration: Crushed with puree Supervision: Staff to assist with self feeding;Full supervision/cueing for compensatory strategies Compensations: Minimize environmental distractions;Slow rate;Small sips/bites Postural Changes and/or Swallow Maneuvers: Seated upright 90 degrees                Oral Care Recommendations: Oral care BID Follow Up Recommendations: Skilled nursing-short term rehab (<3 hours/day) Assistance recommended at discharge: Frequent or constant Supervision/Assistance SLP Visit Diagnosis: Dysphagia, unspecified (R13.10) Plan: Continue with current plan of care           [facility]  04/21/2021, 12:55 PM

## 2021-04-22 DIAGNOSIS — J9601 Acute respiratory failure with hypoxia: Secondary | ICD-10-CM | POA: Diagnosis not present

## 2021-04-22 DIAGNOSIS — F0390 Unspecified dementia without behavioral disturbance: Secondary | ICD-10-CM

## 2021-04-22 DIAGNOSIS — E870 Hyperosmolality and hypernatremia: Secondary | ICD-10-CM | POA: Diagnosis not present

## 2021-04-22 DIAGNOSIS — Z7189 Other specified counseling: Secondary | ICD-10-CM

## 2021-04-22 DIAGNOSIS — R638 Other symptoms and signs concerning food and fluid intake: Secondary | ICD-10-CM

## 2021-04-22 DIAGNOSIS — E860 Dehydration: Secondary | ICD-10-CM

## 2021-04-22 DIAGNOSIS — S065XAA Traumatic subdural hemorrhage with loss of consciousness status unknown, initial encounter: Secondary | ICD-10-CM | POA: Diagnosis not present

## 2021-04-22 DIAGNOSIS — S0636AA Traumatic hemorrhage of cerebrum, unspecified, with loss of consciousness status unknown, initial encounter: Secondary | ICD-10-CM

## 2021-04-22 DIAGNOSIS — Z515 Encounter for palliative care: Secondary | ICD-10-CM

## 2021-04-22 DIAGNOSIS — E119 Type 2 diabetes mellitus without complications: Secondary | ICD-10-CM | POA: Diagnosis not present

## 2021-04-22 LAB — GLUCOSE, CAPILLARY
Glucose-Capillary: 305 mg/dL — ABNORMAL HIGH (ref 70–99)
Glucose-Capillary: 324 mg/dL — ABNORMAL HIGH (ref 70–99)
Glucose-Capillary: 343 mg/dL — ABNORMAL HIGH (ref 70–99)
Glucose-Capillary: 239 mg/dL — ABNORMAL HIGH (ref 70–99)
Glucose-Capillary: 183 mg/dL — ABNORMAL HIGH (ref 70–99)
Glucose-Capillary: 69 mg/dL — ABNORMAL LOW (ref 70–99)
Glucose-Capillary: 101 mg/dL — ABNORMAL HIGH (ref 70–99)

## 2021-04-22 LAB — BASIC METABOLIC PANEL
Anion gap: 9 (ref 5–15)
BUN: 17 mg/dL (ref 8–23)
CO2: 28 mmol/L (ref 22–32)
Calcium: 8.1 mg/dL — ABNORMAL LOW (ref 8.9–10.3)
Chloride: 106 mmol/L (ref 98–111)
Creatinine, Ser: 0.86 mg/dL (ref 0.61–1.24)
GFR, Estimated: >60 mL/min (ref >60)
Glucose, Bld: 289 mg/dL — ABNORMAL HIGH (ref 70–99)
Potassium: 3.7 mmol/L (ref 3.5–5.1)
Sodium: 143 mmol/L (ref 135–145)

## 2021-04-22 MED ORDER — INSULIN GLARGINE-YFGN 100 UNIT/ML ~~LOC~~ SOLN
5.0000 [IU] | Freq: Every day | SUBCUTANEOUS | Status: DC
  Administered 2021-04-22: 5 [IU] via SUBCUTANEOUS
  Filled 2021-04-22 (×2): qty 0.05

## 2021-04-22 MED ORDER — OXYMETAZOLINE HCL 0.05 % NA SOLN
1.0000 | Freq: Two times a day (BID) | NASAL | Status: AC | PRN
  Administered 2021-04-22: 1 via NASAL
  Filled 2021-04-22: qty 30

## 2021-04-22 NOTE — Progress Notes (Signed)
Pt's mitten take off about 1200 PM per [facility] MD's order but around 3 PM pt was trying to pull lines and trying to chew suction catheter so we had to put back on, MD notified.

## 2021-04-22 NOTE — Progress Notes (Signed)
Condom cath removed this am, pt has small skin tear to penis. NO condom cath applied, open to air.

## 2021-04-22 NOTE — Consult Note (Signed)
Palliative Care Consult Note                                  Date: 04/22/2021   Patient Name: John Gilmore  DOB: [birth year]  MRN: [id number]  Age / Sex: 88 y.o., male  PCP: System, Provider Not In Referring Physician: Aldine Contes, MD  Reason for Consultation: Establishing goals of care  HPI/Patient Profile: 88 y.o. male  with past medical history of pancreatic insufficiency, diabetes and dementia of 6 years with sharp decline in the last 2-3 who now resides  in [facility] who presented after several falls with altered mental status and is admitted to medicine service for management of elevated blood sugar and acute kidney injury.  PMT was consulted for [place] discussions.  Past Medical History:  Diagnosis Date   Dementia ([place])    Diabetes mellitus without complication (HCC)     Subjective:   This NP Walden Field reviewed medical records, received report from team, assessed the patient and then meet at the patient's bedside to discuss diagnosis, prognosis, GOC, EOL wishes disposition and options.  I met with the patient at the bedside, although he is not interactive today. I spoke with the patient's son Dr. Armandina Gemma via telephone.    Concept of Palliative Care was introduced as specialized medical care for people and their families living with serious illness.  If focuses on providing relief from the symptoms and stress of a serious illness.  The goal is to improve quality of life for both the patient and the family. Values and goals of care important to patient and family were attempted to be elicited.  Created space and opportunity for patient  and family to explore thoughts and feelings regarding current medical situation   Natural trajectory and current clinical status were discussed. Questions and concerns addressed. Patient  encouraged to call with questions or concerns.    Patient/Family Understanding of  Illness: The patient's son is aware that he has had a sharp decline in his chronic dementia ever since his fall and intracranial bleed.  Previously when he was in the memory care before the fall for a few days he was up and about, eating, pleasantly confused.  He knows that now he is much less interactive, frequent cough and questions aspiration, not eating or drinking much and at this point they feel they are really feeding him more for "comfort feeds".  Life Review: Deferred  Patient Values: Deferred  Goals: No feeding tube.  Other goals to be decided on Friday at family meeting.  Today's Discussion: We discussed his current clinical situation and his sharp decline.  The patient's son asked questions about options and has mentioned hospice independently.  We discussed that this is one of the options and we can further discuss home hospice versus hospice support at long-term care facility versus residential hospice, depending on his qualifications.  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  We decided that the patient's brother is set to arrive from [facility] tomorrow evening and we will plan to meet on Friday at 37 AM for family meeting and further goals discussions.  I provided emotional general support through therapeutic listening, empathy, and other techniques.  I answered all  questions and addressed all concerns to the best of my ability.  Review of Systems  Unable to perform ROS: Dementia   Objective:   Primary Diagnoses: Present on Admission:  Subdural hematoma  ICH (intracerebral hemorrhage) ([place])   Physical Exam Vitals and nursing note reviewed.  Constitutional:      General: He is sleeping. He is not in acute distress. HENT:     Head: Normocephalic and atraumatic.     Nose:     Comments: Noted mild  nose-bleed, RN notified; humidifier to be placed on nasal cannula Cardiovascular:     Rate and Rhythm: Normal rate.     Heart sounds: Normal heart sounds. No murmur heard. Pulmonary:     Effort: Pulmonary effort is normal. No respiratory distress.     Breath sounds: No wheezing or rhonchi.     Comments: On 2L per  Abdominal:     General: Abdomen is flat.     Palpations: Abdomen is soft.  Skin:    General: Skin is warm and dry.  Neurological:     Mental Status: He is lethargic and confused.    Vital Signs:  BP 116/66 (BP Location: Right Arm)    Pulse 90    Temp 98.5 F (36.9 C) (Axillary)    Resp 14    Ht '5\' 7"'  (1.702 m)    Wt 43.4 kg    SpO2 100%    BMI 14.99 kg/m   Palliative Assessment/Data: 20%    Advanced Care Planning:   Primary Decision Maker: NEXT OF KIN  Code Status/Advance Care Planning: DNR  Decisions/Changes to ACP: None today  Assessment & Plan:   Impression: 88 year old male with chronic dementia who just moved into a memory care unit at [facility].  After there for 2 days he had a fall and now has a subdural hematoma.  He has had a sharp decline in his mental capacity/mental status since.  He is not eating enough to support himself, AKI likely due to dehydration from poor intake.  Question of possible aspiration given frequent cough with eating.  His son is clear that they would not want a feeding tube.  They are interested in a family meeting to discuss further options.  He notes that "not a really good".  Overall poor prognosis.  SUMMARY OF RECOMMENDATIONS   Continue current treatments for now Remain DNR Plan family meeting Friday morning at 11:00 AM with the patient's 2 sons Sherren Mocha and Randall Hiss It is imperative that nursing document all fluid and food intake over the next few days Further recommendations will follow family meeting PMT will continue to follow  Symptom Management:  Per primary team PMT is available to assist as needed  Prognosis:  <  6 months  Discharge Planning:  To Be Determined   Discussed with: Medical team, nursing team, patient's family   Thank you for allowing [place] to participate in the care of Theodus Ran PMT will continue to support holistically.  Billing based on MDM: High  1 or more chronic or acute illnesses that pose a threat to life or body function; discussion of management with physician/nursing team; assessment requiring independent historian (son), review of tests including BMP for kidney function and others, review of prior notes from previous encounters; decision regarding DNR and ongoing goals discussions.   Signed by: Walden Field, NP Palliative Medicine Team  Team Phone # [phone] (Nights/Weekends)  04/22/2021, 10:12 AM

## 2021-04-22 NOTE — Progress Notes (Signed)
HD#5 SUBJECTIVE:  Patient Summary: John Gilmore is a 88 year old male with a hx of pancreatic insufficiency, diabetes and dementia of 6 years with sharp decline in the last 2-3 who now resides  in [facility] who presented after several falls with altered mental status and is admitted to medicine service for management of elevated blood sugar and acute kidney injury.  Overnight Events:  No acute events overnight.   Interm History:  Patient seen and evaluated at bedside. He is more awake today. Answers some questions inconsistently and responds to tactile and verbal stimuli. Denies pain.   OBJECTIVE:  Vital Signs: Vitals:   04/21/21 2036 04/21/21 2339 04/22/21 0320 04/22/21 0805  BP: 134/74 (!) 141/78 131/83 116/66  Pulse: 93 100 96 90  Resp: 18 20 17 14   Temp: 98 F (36.7 C) 98.1 F (36.7 C) 98.1 F (36.7 C) 98.5 F (36.9 C)  TempSrc: Axillary   Axillary  SpO2: 96%   100%  Weight:      Height:       Supplemental O2: Room Air SpO2: 100 % O2 Flow Rate (L/min): 3.5 L/min FiO2 (%): (!) 3.5 %  Filed Weights   04/16/21 2002  Weight: 43.4 kg     Intake/Output Summary (Last 24 hours) at 04/22/2021 1146 Last data filed at 04/22/2021 0320 Gross per 24 hour  Intake --  Output 1200 ml  Net -1200 ml   Net IO Since Admission: 1,843.99 mL [04/22/21 1146]  Physical Exam: Constitutional: chronically ill appearing male, cachectic, awake HENT: ecchymoses of right eyelid improved Neck: supple Cardiovascular: regular rate and rhythm, no m/r/g Pulmonary/Chest: normal work of breathing on 2L [place], breath sounds appreciated anteriorly and no noted rales  Abdominal: soft, non-tender, non-distended GU: skin tear unable to be assessed. Condom cath removed MSK: decreased bulk Neurological: Awake, inconsistently responds to verbal and tactile stimuli. Moves all extremities spontaneously.   Skin: warm and dry, skin tear noted over right shin with some bleeding at  site Psych: unable to assess  Patient Lines/Drains/Airways Status     Active Line/Drains/Airways     Name Placement date Placement time Site Days   Peripheral IV 04/16/21 20 G Anterior;Distal;Upper Antecubital 04/16/21  1213  Antecubital  1   Pressure Injury 04/16/21 Sacrum Mid Stage 2 -  Partial thickness loss of dermis presenting as a shallow open injury with a red, pink wound bed without slough. pink 04/16/21  2010  -- 1            Pertinent Labs: CBC Latest Ref Rng & Units 04/21/2021 04/20/2021 04/18/2021  WBC 4.0 - 10.5 K/uL 7.9 8.4 12.3(H)  Hemoglobin 13.0 - 17.0 g/dL 10.5(L) 10.0(L) 11.2(L)  Hematocrit 39.0 - 52.0 % 31.8(L) 29.9(L) 33.6(L)  Platelets 150 - 400 K/uL 107(L) 103(L) 87(L)    CMP Latest Ref Rng & Units 04/22/2021 04/21/2021 04/21/2021  Glucose 70 - 99 mg/dL 289(H) 246(H) 292(H)  BUN 8 - 23 mg/dL 17 17 20   Creatinine 0.61 - 1.24 mg/dL 0.86 0.96 0.98  Sodium 135 - 145 mmol/L 143 147(H) 149(H)  Potassium 3.5 - 5.1 mmol/L 3.7 3.8 4.3  Chloride 98 - 111 mmol/L 106 114(H) 114(H)  CO2 22 - 32 mmol/L 28 26 28   Calcium 8.9 - 10.3 mg/dL 8.1(L) 8.3(L) 8.4(L)  Total Protein 6.5 - 8.1 g/dL - - -  Total Bilirubin 0.3 - 1.2 mg/dL - - -  Alkaline Phos 38 - 126 U/L - - -  AST  15 - 41 U/L - - -  ALT 0 - 44 U/L - - -    Recent Labs    04/22/21 0318 04/22/21 0709 04/22/21 0808  GLUCAP 305* 324* 343*     Pertinent Imaging: No results found.  ASSESSMENT/PLAN:  Assessment: Principal Problem:   Subdural hematoma Active Problems:   Pressure injury of skin   ICH (intracerebral hemorrhage) (HCC)   Roniel Mayton is a 88 y.o. with pertinent PMH of pancreatic insufficiency, diabetes and dementia of 6 years with sharp decline in the last 2-3 who now resides  in [facility] who presented after several falls with altered mental status and is admitted to medicine service for management of blood sugar on hospital day 5  # Altered mental status # History of  dementia # Intraparenchymal bleed  - No surgical intervention.  - patients mental status continues to fluctuate. He is more awake today and responding to questions occasionally. However, he still seems to have difficulty following commands and does not feed himself. It is imperative that nursing document all solid and liquid intake.  - Palliative care has been consulted to guide goals of care discussions. Family meeting planned for Friday at 11am.   Acute hypoxic respiratory failure 2/2 suspected aspiration pneumonia Initial concern for aspiration pneumonia, treated with course of ceftriaxone. He has been afebrile, leukocytosis returned to normal. Completed course of CTX. Satting 95-100% RA.   Hypernatremia 143 this AM, corrects to 148. Holding fluids for now.   Diabetes mellitus type 2 Patient has been managed with meal time sliding scale since he started taking PO. Morning CBG has been elevated. Started on low dose long acting given poor PO intake.   # Acute kidney injury - resolved. Monitor with daily BMP   #Pancreatic insufficiency - on creon at home.  Restarted with meals.    #Sacral wound No skin breakdown noted during bandaging. - continue to monitor, keep wound clean, dry, and intact. Frequent repositioning.   #Hx of hypertriglyceridemia - most recent LDL appears to be 133  Diet: dysphagia 1 VTE:  SCDs Code: DNR/DNI Prior to Admission Living Arrangement:  [facility] Anticipated Discharge Location: SNF Barriers to Discharge: medical stability  Dispo: Admit patient to Inpatient with expected length of stay greater than 2 midnights.  Signature: Delene Ruffini, PGY1 Internal Medicine  Pager [phone]   Please contact the on call pager after 5 pm and on weekends at [phone].

## 2021-04-22 NOTE — Progress Notes (Signed)
Occupational Therapy Treatment Patient Details Name: John Gilmore MRN: [id number] DOB: 1931-03-05 Today's Date: 04/22/2021   History of present illness 88 yo male presents to [facility] 2/2 from [facility] with fall with + head trauma. CTH shows bilat parenchymal hemorrhages with surrounding edema and mass effect, frontal subdural hemorrhage; neurosurgery recommends no surgical intervention. PMH includes dementia, DMII.   OT comments  Pt is not making functional progress towards his acute OT goals. He continues to required max-total A for all ADLs with poor sitting balance and no attempt to assist with a standing transfer. He remains limited by cognition, weakness and balance. Pt will continues to benefit from OT efforts acutely. D/c remains appropriate.    Recommendations for follow up therapy are one component of a multi-disciplinary discharge planning process, led by the attending physician.  Recommendations may be updated based on patient status, additional functional criteria and insurance authorization.    Follow Up Recommendations  Other (comment)    Assistance Recommended at Discharge Frequent or constant Supervision/Assistance  Patient can return home with the following  A lot of help with walking and/or transfers;A lot of help with bathing/dressing/bathroom;Assistance with cooking/housework;Assistance with feeding;Direct supervision/assist for medications management;Assist for transportation;Help with stairs or ramp for entrance   Equipment Recommendations  Other (comment);Hospital bed;Wheelchair (measurements OT);BSC/3in1    Recommendations for Other Services      Precautions / Restrictions Precautions Precautions: Fall Precaution Comments: advanced dementia Restrictions Weight Bearing Restrictions: No       Mobility Bed Mobility Overal bed mobility: Needs Assistance Bed Mobility: Sit to Supine     Supine to sit: Total assist Sit to supine: Total  assist   General bed mobility comments: required assist for all aspects of task, initally with posterior lean upon sitting, progressed to mod A for sitting balance with L/R lateral leaning intermittently    Transfers                   General transfer comment: attempted to stand with minimal hip clearance from bed, use of bed pad and minimal to no pt involvement.     Balance Overall balance assessment: Needs assistance, History of Falls Sitting-balance support: Feet supported Sitting balance-Leahy Scale: Poor Sitting balance - Comments: min-mod A fro sitting balance Postural control: Right lateral lean, Left lateral lean, Posterior lean                                 ADL either performed or assessed with clinical judgement   ADL Overall ADL's : Needs assistance/impaired         General ADL Comments: pt is ultimately max-total A for all ADLs due to impaired cognition, generalized weakness and poor balance    Extremity/Trunk Assessment Upper Extremity Assessment Upper Extremity Assessment: Generalized weakness   Lower Extremity Assessment Lower Extremity Assessment: Defer to PT evaluation        Vision   Vision Assessment?: Vision impaired- to be further tested in functional context Additional Comments: continues to have a R gaze preference   Perception Perception Perception: Not tested   Praxis Praxis Praxis: Not tested    Cognition Arousal/Alertness: Awake/alert Behavior During Therapy: Restless Overall Cognitive Status: History of cognitive impairments - at baseline           General Comments: pt with advanced dementia, oriented to his name only. followed <10% of commands. Pleasantly confused.  General Comments VSS on RA, upon sitting pt experienced nose bleed. RN notified.    Pertinent Vitals/ Pain       Pain Assessment Pain Assessment: Faces Faces Pain Scale: Hurts a little bit Pain Location: BLE when sitting  EOB Pain Descriptors / Indicators: Grimacing Pain Intervention(s): Limited activity within patient's tolerance, Monitored during session   Frequency  Min 2X/week        Progress Toward Goals  OT Goals(current goals can now be found in the care plan section)  Progress towards OT goals: Not progressing toward goals - comment  Acute Rehab OT Goals Patient Stated Goal: unable to state OT Goal Formulation: With patient Time For Goal Achievement: 05/04/21 Potential to Achieve Goals: Good ADL Goals Pt Will Perform Eating: with set-up;sitting Pt Will Perform Grooming: with set-up;sitting Pt Will Perform Upper Body Dressing: with set-up;sitting Pt Will Perform Lower Body Dressing: with min assist;sit to/from stand Pt Will Transfer to Toilet: with min assist;stand pivot transfer;bedside commode Additional ADL Goal #1: Pt will indep complete bed mobility as a precursor to ADLs  Plan Discharge plan remains appropriate    Co-evaluation                 AM-PAC OT "6 Clicks" Daily Activity     Outcome Measure   Help from another person eating meals?: A Lot Help from another person taking care of personal grooming?: A Lot Help from another person toileting, which includes using toliet, bedpan, or urinal?: Total Help from another person bathing (including washing, rinsing, drying)?: A Lot Help from another person to put on and taking off regular upper body clothing?: A Lot Help from another person to put on and taking off regular lower body clothing?: Total 6 Click Score: 10    End of Session    OT Visit Diagnosis: Unsteadiness on feet (R26.81);Other abnormalities of gait and mobility (R26.89);Muscle weakness (generalized) (M62.81);History of falling (Z91.81);Other symptoms and signs involving cognitive function   Activity Tolerance Patient tolerated treatment well   Patient Left in bed;with call bell/phone within reach;with bed alarm set;with restraints reapplied   Nurse  Communication Mobility status        Time: 2263-3354 OT Time Calculation (min): 15 min  Charges: OT General Charges $OT Visit: 1 Visit OT Treatments $Therapeutic Activity: 8-22 mins   John Gilmore A John Gilmore 04/22/2021, 5:21 PM

## 2021-04-22 NOTE — Progress Notes (Signed)
Trying to feed him dinner, multiple attempts made but he is very agitated and spitting his food. Will continue try to feed him.

## 2021-04-23 DIAGNOSIS — S065XAA Traumatic subdural hemorrhage with loss of consciousness status unknown, initial encounter: Secondary | ICD-10-CM | POA: Diagnosis not present

## 2021-04-23 LAB — BASIC METABOLIC PANEL
Anion gap: 10 (ref 5–15)
BUN: 16 mg/dL (ref 8–23)
CO2: 28 mmol/L (ref 22–32)
Calcium: 8.3 mg/dL — ABNORMAL LOW (ref 8.9–10.3)
Chloride: 105 mmol/L (ref 98–111)
Creatinine, Ser: 0.85 mg/dL (ref 0.61–1.24)
GFR, Estimated: >60 mL/min (ref >60)
Glucose, Bld: 211 mg/dL — ABNORMAL HIGH (ref 70–99)
Potassium: 3.6 mmol/L (ref 3.5–5.1)
Sodium: 143 mmol/L (ref 135–145)

## 2021-04-23 LAB — CBC
HCT: 30.1 % — ABNORMAL LOW (ref 39.0–52.0)
Hemoglobin: 10.5 g/dL — ABNORMAL LOW (ref 13.0–17.0)
MCH: 31.2 pg (ref 26.0–34.0)
MCHC: 34.9 g/dL (ref 30.0–36.0)
MCV: 89.3 fL (ref 80.0–100.0)
Platelets: 121 10*3/uL — ABNORMAL LOW (ref 150–400)
RBC: 3.37 MIL/uL — ABNORMAL LOW (ref 4.22–5.81)
RDW: 12.5 % (ref 11.5–15.5)
WBC: 9 10*3/uL (ref 4.0–10.5)
nRBC: 0 % (ref 0.0–0.2)

## 2021-04-23 LAB — GLUCOSE, CAPILLARY
Glucose-Capillary: 211 mg/dL — ABNORMAL HIGH (ref 70–99)
Glucose-Capillary: 222 mg/dL — ABNORMAL HIGH (ref 70–99)
Glucose-Capillary: 221 mg/dL — ABNORMAL HIGH (ref 70–99)
Glucose-Capillary: 226 mg/dL — ABNORMAL HIGH (ref 70–99)
Glucose-Capillary: 343 mg/dL — ABNORMAL HIGH (ref 70–99)
Glucose-Capillary: 206 mg/dL — ABNORMAL HIGH (ref 70–99)

## 2021-04-23 MED ORDER — INSULIN GLARGINE-YFGN 100 UNIT/ML ~~LOC~~ SOLN
10.0000 [IU] | Freq: Every day | SUBCUTANEOUS | Status: DC
  Administered 2021-04-23 – 2021-04-28 (×6): 10 [IU] via SUBCUTANEOUS
  Filled 2021-04-23 (×7): qty 0.1

## 2021-04-23 NOTE — Progress Notes (Signed)
HD#6 SUBJECTIVE:  Patient Summary: John Gilmore is a 88 year old male with a hx of pancreatic insufficiency, diabetes and dementia of 6 years with sharp decline in the last 2-3 who now resides  in [facility] who presented after several falls with altered mental status and is admitted to medicine service for management of elevated blood sugar and acute kidney injury.  Overnight Events:  No acute events overnight.   Interm History:  Patient seen and evaluated at bedside. Mental status worse today. Appears to be waxing and waning. He has been pulling at IV.  Not able to feed himself.  OBJECTIVE:  Vital Signs: Vitals:   04/23/21 0318 04/23/21 0842 04/23/21 1115 04/23/21 1500  BP: 120/73 (!) 156/138 111/61 104/65  Pulse: 89 100 (!) 101 (!) 105  Resp: 19 16 20 20   Temp: 98.4 F (36.9 C) 99.2 F (37.3 C) 98.2 F (36.8 C) 98.4 F (36.9 C)  TempSrc: Oral Oral Oral Rectal  SpO2: 94%  100% 99%  Weight:      Height:       Supplemental O2: Room Air SpO2: 99 % O2 Flow Rate (L/min): 2 L/min FiO2 (%): (!) 3.5 %  Filed Weights   04/16/21 2002  Weight: 43.4 kg     Intake/Output Summary (Last 24 hours) at 04/23/2021 1654 Last data filed at 04/23/2021 1523 Gross per 24 hour  Intake 420 ml  Output 675 ml  Net -255 ml   Net IO Since Admission: 1,818.99 mL [04/23/21 1654]  Physical Exam: Constitutional: chronically ill appearing male, cachectic, awake HENT: ecchymoses of right eyelid improved Neck: supple Cardiovascular: regular rate and rhythm, no m/r/g Pulmonary/Chest: normal work of breathing on 2L [place], breath sounds appreciated anteriorly and no noted rales  Abdominal: soft, non-tender, non-distended GU: skin tear unable to be assessed. Condom cath removed MSK: decreased bulk Neurological: Awake, inconsistently responds to verbal and tactile stimuli. Moves all extremities spontaneously.   Skin: warm and dry, skin tear noted over right shin with some  bleeding at site Psych: unable to assess  Patient Lines/Drains/Airways Status     Active Line/Drains/Airways     Name Placement date Placement time Site Days   Peripheral IV 04/16/21 20 G Anterior;Distal;Upper Antecubital 04/16/21  1213  Antecubital  1   Pressure Injury 04/16/21 Sacrum Mid Stage 2 -  Partial thickness loss of dermis presenting as a shallow open injury with a red, pink wound bed without slough. pink 04/16/21  2010  -- 1            Pertinent Labs: CBC Latest Ref Rng & Units 04/23/2021 04/21/2021 04/20/2021  WBC 4.0 - 10.5 K/uL 9.0 7.9 8.4  Hemoglobin 13.0 - 17.0 g/dL 10.5(L) 10.5(L) 10.0(L)  Hematocrit 39.0 - 52.0 % 30.1(L) 31.8(L) 29.9(L)  Platelets 150 - 400 K/uL 121(L) 107(L) 103(L)    CMP Latest Ref Rng & Units 04/23/2021 04/22/2021 04/21/2021  Glucose 70 - 99 mg/dL 211(H) 289(H) 246(H)  BUN 8 - 23 mg/dL 16 17 17   Creatinine 0.61 - 1.24 mg/dL 0.85 0.86 0.96  Sodium 135 - 145 mmol/L 143 143 147(H)  Potassium 3.5 - 5.1 mmol/L 3.6 3.7 3.8  Chloride 98 - 111 mmol/L 105 106 114(H)  CO2 22 - 32 mmol/L 28 28 26   Calcium 8.9 - 10.3 mg/dL 8.3(L) 8.1(L) 8.3(L)  Total Protein 6.5 - 8.1 g/dL - - -  Total Bilirubin 0.3 - 1.2 mg/dL - - -  Alkaline Phos 38 - 126  U/L - - -  AST 15 - 41 U/L - - -  ALT 0 - 44 U/L - - -    Recent Labs    04/23/21 0844 04/23/21 1118 04/23/21 1526  GLUCAP 221* 226* 343*     Pertinent Imaging: No results found.  ASSESSMENT/PLAN:  Assessment: Principal Problem:   Subdural hematoma Active Problems:   Pressure injury of skin   ICH (intracerebral hemorrhage) (HCC)   John Gilmore is a 88 y.o. with pertinent PMH of pancreatic insufficiency, diabetes and dementia of 6 years with sharp decline in the last 2-3 who now resides  in [facility] who presented after several falls with altered mental status and is admitted to medicine service for management of blood sugar on hospital day 6  # Altered mental status # History of  dementia # Intraparenchymal bleed  - No surgical intervention.  - patients mental status continues to fluctuate. He has been unable to feed himself. Mitts in place as patient continues to pull at lines.   - Palliative care has been consulted to guide goals of care discussions. Family meeting planned for Friday at 11am.   Acute hypoxic respiratory failure 2/2 suspected aspiration pneumonia Initial concern for aspiration pneumonia, treated with course of ceftriaxone. He has been afebrile, leukocytosis returned to normal. Completed course of CTX. Satting 95-100% RA.   Hypernatremia 143 this AM, corrects to 148. Holding fluids for now.   Diabetes mellitus type 2 Patient has been managed with meal time sliding scale since he started taking PO. Morning CBG has been elevated. Started on low dose long acting given poor PO intake.   # Acute kidney injury - resolved. Monitor with daily BMP   #Pancreatic insufficiency - on creon at home.  Restarted with meals.    #Sacral wound No skin breakdown noted during bandaging. - continue to monitor, keep wound clean, dry, and intact. Frequent repositioning.   #Hx of hypertriglyceridemia - most recent LDL appears to be 133  Diet: dysphagia 1 VTE:  SCDs Code: DNR/DNI Prior to Admission Living Arrangement:  [facility] Anticipated Discharge Location: SNF Barriers to Discharge: medical stability  Dispo: Admit patient to Inpatient with expected length of stay greater than 2 midnights.  Signature: Delene Ruffini, PGY1 Internal Medicine  Pager [phone]   Please contact the on call pager after 5 pm and on weekends at [phone].

## 2021-04-23 NOTE — Plan of Care (Signed)

## 2021-04-23 NOTE — Progress Notes (Signed)
Physical Therapy Treatment Patient Details Name: John Gilmore MRN: [id number] DOB: 1930-05-15 Today's Date: 04/23/2021   History of Present Illness 88 yo male [facility]2 from [facility] with fall with + head trauma. CTH shows bilat parenchymal hemorrhages with surrounding edema and mass effect, frontal subdural hemorrhage; neurosurgery recommends no surgical intervention. PMH includes dementia, DMII.    PT Comments    Pt asleep on arrival.  "Soft" attempts to awaken still produced irritation.  Unable to help pt stay calm and directed to task.  Emphasis on warm up ROM, transition to sitting EOB, work on balance at EOB, trial of standing.  Limited overall by agitation and restlessness, pt wanting to lie down and be left alone.    Recommendations for follow up therapy are one component of a multi-disciplinary discharge planning process, led by the attending physician.  Recommendations may be updated based on patient status, additional functional criteria and insurance authorization.  Follow Up Recommendations  Skilled nursing-short term rehab (<3 hours/day)     Assistance Recommended at Discharge Frequent or constant Supervision/Assistance  Patient can return home with the following Two people to help with bathing/dressing/bathroom;Two people to help with walking and/or transfers;Direct supervision/assist for medications management;Direct supervision/assist for financial management;Assist for transportation   Equipment Recommendations       Recommendations for Other Services       Precautions / Restrictions Precautions Precautions: Fall Precaution Comments: advanced dementia Restrictions Weight Bearing Restrictions: No     Mobility  Bed Mobility Overal bed mobility: Needs Assistance Bed Mobility: Supine to Sit, Sit to Supine     Supine to sit: Max assist Sit to supine: Max assist, +2 for physical assistance, Total assist   General bed mobility  comments: pt was at times trying to help and at others was resistant.    Transfers Overall transfer level: Needs assistance   Transfers: Sit to/from Stand Sit to Stand: Max assist           General transfer comment: resistant to stand and got more agitated with attempts to coerce the stand so further attempt aborted in lieu of continued sitting at EOB    Ambulation/Gait               General Gait Details: not able   Stairs             Wheelchair Mobility    Modified Rankin (Stroke Patients Only) Modified Rankin (Stroke Patients Only) Pre-Morbid Rankin Score: Moderately severe disability Modified Rankin: Severe disability     Balance Overall balance assessment: Needs assistance, History of Falls Sitting-balance support: Feet supported Sitting balance-Leahy Scale: Poor Sitting balance - Comments: min-mod A fro sitting balance (today a little resistant)                                    Cognition Arousal/Alertness: Awake/alert, Lethargic Behavior During Therapy: Restless Overall Cognitive Status: History of cognitive impairments - at baseline                                 General Comments: appeared to be oriented to his name.  Pt was awoken and never got completely calm like he might have if he had awoken himself.        Exercises Other Exercises Other Exercises: warm up of bil LE's with AA/AROM  prior to mobility    General Comments        Pertinent Vitals/Pain Pain Assessment Pain Assessment: PAINAD Faces Pain Scale: Hurts a little bit Breathing: normal Negative Vocalization: occasional moan/groan, low speech, negative/disapproving quality Facial Expression: facial grimacing Body Language: tense, distressed pacing, fidgeting Consolability: distracted or reassured by voice/touch PAINAD Score: 5 Pain Location: BLE when sitting EOB Pain Descriptors / Indicators: Grimacing Pain Intervention(s): Monitored during  session, Limited activity within patient's tolerance    Home Living                          Prior Function            PT Goals (current goals can now be found in the care plan section) Acute Rehab PT Goals Patient Stated Goal: pt unable to participate PT Goal Formulation: Patient unable to participate in goal setting Time For Goal Achievement: 05/04/21 Potential to Achieve Goals: Fair Progress towards PT goals: Not progressing toward goals - comment (pt was agitated and unable to redirect or calm today.)    Frequency    Min 2X/week      PT Plan Current plan remains appropriate    Co-evaluation              AM-PAC PT "6 Clicks" Mobility   Outcome Measure  Help needed turning from your back to your side while in a flat bed without using bedrails?: A Lot Help needed moving from lying on your back to sitting on the side of a flat bed without using bedrails?: A Lot Help needed moving to and from a bed to a chair (including a wheelchair)?: Total Help needed standing up from a chair using your arms (e.g., wheelchair or bedside chair)?: Total Help needed to walk in hospital room?: Total Help needed climbing 3-5 steps with a railing? : Total 6 Click Score: 8    End of Session Equipment Utilized During Treatment: Oxygen Activity Tolerance: Treatment limited secondary to agitation;Patient limited by fatigue Patient left: in bed;with call bell/phone within reach;with bed alarm set;Other (comment) (mitts) Nurse Communication: Mobility status PT Visit Diagnosis: Other abnormalities of gait and mobility (R26.89);History of falling (Z91.81)     Time: 1135-1150 PT Time Calculation (min) (ACUTE ONLY): 15 min  Charges:  $Therapeutic Activity: 8-22 mins                     04/23/2021  [name]., PT Acute Rehabilitation Services [phone]  (pager) [phone]  (office)   [name] 04/23/2021, 12:47 PM

## 2021-04-23 NOTE — Progress Notes (Signed)
Pts CBG 69, PO sweet tea given, pt consumed 62ml and half of a apple sauce. Pt refused to continue to eat or drink. CBG rechecked: 101. Pt resting no distress noted. Oral care completed pt pushed staff hand away with swab. Pt repositioned.

## 2021-04-23 NOTE — Plan of Care (Signed)

## 2021-04-24 DIAGNOSIS — L8990 Pressure ulcer of unspecified site, unspecified stage: Secondary | ICD-10-CM

## 2021-04-24 DIAGNOSIS — S065XAA Traumatic subdural hemorrhage with loss of consciousness status unknown, initial encounter: Secondary | ICD-10-CM | POA: Diagnosis not present

## 2021-04-24 DIAGNOSIS — J9690 Respiratory failure, unspecified, unspecified whether with hypoxia or hypercapnia: Secondary | ICD-10-CM

## 2021-04-24 LAB — GLUCOSE, CAPILLARY
Glucose-Capillary: 152 mg/dL — ABNORMAL HIGH (ref 70–99)
Glucose-Capillary: 153 mg/dL — ABNORMAL HIGH (ref 70–99)
Glucose-Capillary: 206 mg/dL — ABNORMAL HIGH (ref 70–99)

## 2021-04-24 NOTE — Progress Notes (Signed)
Daily Progress Note   Patient Name: John Gilmore       Date: 04/24/2021 DOB: 1930-10-19  Age: 88 y.o. MRN#: [id number] Attending Physician: Aldine Contes, MD Primary Care Physician: System, Provider Not In Admit Date: 04/16/2021 Length of Stay: 7 days  Reason for Consultation/Follow-up: Establishing goals of care  HPI/Patient Profile:  pancreatic insufficiency, diabetes and dementia of 6 years with sharp decline in the last 2-3 who now resides  in [facility] who presented after several falls with altered mental status and is admitted to medicine service for management of elevated blood sugar and acute kidney injury. Also noted intraparenchymal bleed from fall, no surgical intervention.   PMT was consulted for GOC  Subjective:   Subjective: Chart Reviewed. Updates received. Patient Assessed. Created space and opportunity for patient  and family to explore thoughts and feelings regarding current medical situation.  Today's Discussion: I met with the patient's 2 sons Randall Hiss and Sherren Mocha along with Dr. Elliot Gurney in the conference room.  We had an extensive discussion about the patient's current clinical status.  They share that it has been a long 3 to 4 months.  The patient and his wife moved from [place] to [place]'s home about 1-1/2 years ago and then moved into [facility] independent living.  The patient's wife is frail but mentally sharp.  States they have had a good 3 years.  However when the patient's wife had a perforated peptic ulcer and was in the hospital and in rehab for 3 weeks the patient stayed with Sherren Mocha for 9 days and then moved to have it would with around-the-clock nursing care due to his dementia.  Eventually he was moved to [facility] memory [facility].  He was there for 2 days and then fell and suffered a brain bleed and had a rapid decline sent.  They understand there are no neurosurgery options.  Since admission Todd notes progressive mental decline over the past  few days that he attributes to the brain bleed.  Currently he notes that he is sleeping a lot, will wake up and ramble a bit, and then sleep again.  [facility] care cannot except the patient back in his condition.  Regarding diet they note that he is eating better on a pured and thickened liquid.  If you do not feed him he will not eat but if you do feed him he will eat 100%.  He is taking in less fluids however.  We discussed aggressive care versus more comfort focus.  While they would like to continue current medical treatments while he is in the hospital they have opted to transition him to a long-term care facility.  They prefer [facility] in [place], [place].  They are open to other options if that falls through but their main priority is quality care.  The son has previously spoken to [facility] and he states that he will reach back out to them.  They are also excepting of "home" hospice services at the facility.  We agreed to engage with hospice for an evaluation.  Provided emotional general support therapeutic listening, therapeutic silence, sharing stories, laughter, and other techniques.  I answered all questions and addressed all concerns to the best of my ability.  Review of Systems  Unable to perform ROS: Dementia   Objective:   Vital Signs:  BP 124/64 (BP Location: Left Arm)    Pulse 99    Temp 97.7 F (36.5 C) (Oral)    Resp  20    Ht '5\' 7"'  (1.702 m)    Wt 43.4 kg    SpO2 94%    BMI 14.99 kg/m   Physical Exam: Physical Exam Vitals and nursing note reviewed.  Constitutional:      General: He is not in acute distress.    Appearance: He is ill-appearing.  HENT:     Head: Normocephalic and atraumatic.  Pulmonary:     Effort: Pulmonary effort is normal. No respiratory distress.  Neurological:     Mental Status: He is alert. He is confused.  Psychiatric:        Mood and Affect: Mood normal.        Behavior: Behavior normal.    Palliative Assessment/Data:  30%   Assessment & Plan:   Impression: Present on Admission:  Subdural hematoma  ICH (intracerebral hemorrhage) ([place])  88 year old male with chronic dementia who just moved into a memory care unit at [facility].  After there for 2 days he had a fall and now has a subdural hematoma.  He has had a sharp decline in his mental capacity/mental status since.  He is not eating enough to support himself, AKI likely due to dehydration from poor intake.  Question of possible aspiration given frequent cough with eating.  His son is clear that they would not want a feeding tube.  They are interested in transition to long term care (prefer [facility]) with hospice services.  Overall poor prognosis.  SUMMARY OF RECOMMENDATIONS   Engagement of TOC to identify other possible facilities besides [facility] The patient's son will contact [facility] about transition there TOC consult for home hospice with [facility] Continues to treat the treatable Remain DNR PMT will follow peripherally now that goals and discharge plan are identified Please contact us for further assistance or new needs  Symptom Management:  Per primary team BMP is available to assist as needed  Code Status: DNR  Prognosis: < 6 months  Discharge Planning: [place] with Hospice  Discussed with: Patient's sons, medical team, nursing team, [facility] team, hospice team  Thank you for allowing [place] to participate in the care of Kaydan Wong PMT will continue to support holistically.  Time Total: 75 min  Visit consisted of counseling and education dealing with the complex and emotionally intense issues of symptom management and palliative care in the setting of serious and potentially life-threatening illness. Greater than 50%  of this time was spent counseling and coordinating care related to the above assessment and plan.  Walden Field, NP Palliative Medicine Team  Team Phone # [phone] (Nights/Weekends)   11/11/2020, 8:17 AM

## 2021-04-24 NOTE — Plan of Care (Signed)
Pt is awake and alert, oral care completed. Pt offered water, drank . Right mitten removed pt held cup of water. Pt speaking but would not answer questions asking. Pt repositioned in bed.   Problem: Education: Goal: Knowledge of General Education information will improve Description: Including pain rating scale, medication(s)/side effects and non-pharmacologic comfort measures Outcome: Progressing   Problem: Health Behavior/Discharge Planning: Goal: Ability to manage health-related needs will improve Outcome: Progressing   Problem: Clinical Measurements: Goal: Ability to maintain clinical measurements within normal limits will improve Outcome: Progressing Goal: Will remain free from infection Outcome: Progressing Goal: Diagnostic test results will improve Outcome: Progressing Goal: Respiratory complications will improve Outcome: Progressing Goal: Cardiovascular complication will be avoided Outcome: Progressing   Problem: Activity: Goal: Risk for activity intolerance will decrease Outcome: Progressing   Problem: Nutrition: Goal: Adequate nutrition will be maintained Outcome: Progressing   Problem: Coping: Goal: Level of anxiety will decrease Outcome: Progressing   Problem: Elimination: Goal: Will not experience complications related to bowel motility Outcome: Progressing Goal: Will not experience complications related to urinary retention Outcome: Progressing   Problem: Pain Managment: Goal: General experience of comfort will improve Outcome: Progressing   Problem: Safety: Goal: Ability to remain free from injury will improve Outcome: Progressing   Problem: Skin Integrity: Goal: Risk for impaired skin integrity will decrease Outcome: Progressing

## 2021-04-24 NOTE — Progress Notes (Signed)
HD#7 SUBJECTIVE:  Patient Summary: John Gilmore is a 88 year old male with a hx of pancreatic insufficiency, diabetes and dementia of 6 years with sharp decline in the last 2-3 who now resides  in [facility] who presented after several falls with altered mental status and is admitted to medicine service for management of elevated blood sugar and acute kidney injury.  Overnight Events:  No acute events overnight.   Interm History:  Patient seen and evaluated at bedside. Patient not responding to verbal or physical stimuli.   OBJECTIVE:  Vital Signs: Vitals:   04/24/21 0337 04/24/21 0749 04/24/21 0751 04/24/21 1108  BP: 131/60  140/69 124/64  Pulse: 95 98 94 99  Resp:  16 16 20   Temp: 98.4 F (36.9 C) 98.3 F (36.8 C) 98.1 F (36.7 C) 97.7 F (36.5 C)  TempSrc: Oral Oral Oral Oral  SpO2:  95% 94%   Weight:      Height:       Supplemental O2: Room Air SpO2: 94 % O2 Flow Rate (L/min): 2 L/min FiO2 (%): (!) 3.5 %  Filed Weights   04/16/21 2002  Weight: 43.4 kg     Intake/Output Summary (Last 24 hours) at 04/24/2021 1419 Last data filed at 04/24/2021 1130 Gross per 24 hour  Intake 120 ml  Output 1750 ml  Net -1630 ml   Net IO Since Admission: 588.99 mL [04/24/21 1419]  Physical Exam: Constitutional: chronically ill appearing male, cachectic, awake HENT: ecchymoses of right eyelid improved Neck: supple Cardiovascular: regular rate and rhythm, no m/r/g Pulmonary/Chest: normal work of breathing on 2L [place], breath sounds appreciated anteriorly and no noted rales  Abdominal: soft, non-tender, non-distended GU: skin tear unable to be assessed. Condom cath removed MSK: decreased bulk Neurological:  inconsistently responds to verbal and tactile stimuli. Moves all extremities spontaneously.   Skin: warm and dry, skin tear noted over right shin with some bleeding at site Psych: unable to assess  Patient Lines/Drains/Airways Status     Active  Line/Drains/Airways     Name Placement date Placement time Site Days   Peripheral IV 04/16/21 20 G Anterior;Distal;Upper Antecubital 04/16/21  1213  Antecubital  1   Pressure Injury 04/16/21 Sacrum Mid Stage 2 -  Partial thickness loss of dermis presenting as a shallow open injury with a red, pink wound bed without slough. pink 04/16/21  2010  -- 1            Pertinent Labs: CBC Latest Ref Rng & Units 04/23/2021 04/21/2021 04/20/2021  WBC 4.0 - 10.5 K/uL 9.0 7.9 8.4  Hemoglobin 13.0 - 17.0 g/dL 10.5(L) 10.5(L) 10.0(L)  Hematocrit 39.0 - 52.0 % 30.1(L) 31.8(L) 29.9(L)  Platelets 150 - 400 K/uL 121(L) 107(L) 103(L)    CMP Latest Ref Rng & Units 04/23/2021 04/22/2021 04/21/2021  Glucose 70 - 99 mg/dL 211(H) 289(H) 246(H)  BUN 8 - 23 mg/dL 16 17 17   Creatinine 0.61 - 1.24 mg/dL 0.85 0.86 0.96  Sodium 135 - 145 mmol/L 143 143 147(H)  Potassium 3.5 - 5.1 mmol/L 3.6 3.7 3.8  Chloride 98 - 111 mmol/L 105 106 114(H)  CO2 22 - 32 mmol/L 28 28 26   Calcium 8.9 - 10.3 mg/dL 8.3(L) 8.1(L) 8.3(L)  Total Protein 6.5 - 8.1 g/dL - - -  Total Bilirubin 0.3 - 1.2 mg/dL - - -  Alkaline Phos 38 - 126 U/L - - -  AST 15 - 41 U/L - - -  ALT 0 -  44 U/L - - -    Recent Labs    04/23/21 1951 04/24/21 0643 04/24/21 1110  GLUCAP 206* 152* 153*     Pertinent Imaging: No results found.  ASSESSMENT/PLAN:  Assessment: Principal Problem:   Subdural hematoma Active Problems:   Pressure injury of skin   ICH (intracerebral hemorrhage) (HCC)   John Gilmore is a 88 y.o. with pertinent PMH of pancreatic insufficiency, diabetes and dementia of 6 years with sharp decline in the last 2-3 who now resides  in [facility] who presented after several falls with altered mental status and is admitted to medicine service for management of blood sugar on hospital day 7  # Altered mental status # History of dementia # Intraparenchymal bleed  - No surgical intervention.  - patients mental status  continues to fluctuate. He has been unable to feed himself. Mitts in place as patient continues to pull at lines.   - GOC discussion with palliative and two sons today. Patient to be transitioned to home hospice at long term care. Family prefers [facility]. Will continue with current treatments while in the hospital.   Acute hypoxic respiratory failure 2/2 suspected aspiration pneumonia Received course of CTX. Pneumonia resolved.  - continue to monitor.   Hypernatremia Resolved.   Diabetes mellitus type 2 Cotinue current basal insulin and meal time sliding scale  # Acute kidney injury - resolved.    #Pancreatic insufficiency - on creon at home.  Restarted with meals.    #Sacral wound No skin breakdown noted during bandaging. - continue to monitor, keep wound clean, dry, and intact. Frequent repositioning.   #Hx of hypertriglyceridemia - most recent LDL appears to be 133  Diet: dysphagia 1 VTE:  SCDs Code: DNR/DNI Prior to Admission Living Arrangement:  [facility] Anticipated Discharge Location: SNF with home hospice Dispo: Admit patient to Inpatient with expected length of stay greater than 2 midnights.  Signature: Delene Ruffini, PGY1 Internal Medicine  Pager [phone]   Please contact the on call pager after 5 pm and on weekends at [phone].

## 2021-04-24 NOTE — TOC Initial Note (Signed)
Transition of Care [facility]) - Initial/Assessment Note    Patient Details  Name: John Gilmore MRN: [id number] Date of Birth: Aug 12, 1930  Transition of Care [facility]) CM/SW Contact:    Baldemar Lenis, LCSW Phone Number: 04/24/2021, 4:14 PM  Clinical Narrative:       CSW spoke with patient's son, Dr. Darnell Level, via phone to discuss need for SNF placement with hospice. Patient's family would prefer [facility], if possible. CSW spoke with [facility], they are unsure if they will have a bed but they will try to work something out. Family would be agreeable to looking at other options, if need be, as long as the care would be good at the facility. CSW to fax out and follow.            Expected Discharge Plan: Skilled Nursing Facility Barriers to Discharge: Continued Medical Work up, Awaiting State Approval (PASRR)   Patient Goals and CMS Choice Patient states their goals for this hospitalization and ongoing recovery are:: patient unable to participate in goal setting, not oriented CMS Medicare.gov Compare Post Acute Care list provided to:: Patient Represenative (must comment) Choice offered to / list presented to : Adult Children  Expected Discharge Plan and Services Expected Discharge Plan: Skilled Nursing Facility     Post Acute Care Choice: Skilled Nursing Facility Living arrangements for the past 2 months: Assisted Living Facility                                      Prior Living Arrangements/Services Living arrangements for the past 2 months: Assisted Living Facility Lives with:: Facility Resident Patient language and need for interpreter reviewed:: No Do you feel safe going back to the place where you live?: Yes      Need for Family Participation in Patient Care: Yes (Comment) Care giver support system in place?: No (comment)   Criminal Activity/Legal Involvement Pertinent to Current Situation/Hospitalization: No - Comment as needed  Activities of Daily  Living Home Assistive Devices/Equipment: None ADL Screening (condition at time of admission) Patient's cognitive ability adequate to safely complete daily activities?: No Is the patient deaf or have difficulty hearing?: Yes Does the patient have difficulty seeing, even when wearing glasses/contacts?: No Does the patient have difficulty concentrating, remembering, or making decisions?: Yes Patient able to express need for assistance with ADLs?: No Does the patient have difficulty dressing or bathing?: Yes Independently performs ADLs?: No Does the patient have difficulty walking or climbing stairs?: Yes Weakness of Legs: Both Weakness of Arms/Hands: Both  Permission Sought/Granted Permission sought to share information with : Facility Medical sales representative, Family Supports Permission granted to share information with : Yes, Verbal Permission Granted  Share Information with NAME: Norlene Campbell  Permission granted to share info w AGENCY: SNF  Permission granted to share info w Relationship: Sons     Emotional Assessment   Attitude/Demeanor/Rapport: Unable to Assess Affect (typically observed): Unable to Assess   Alcohol / Substance Use: Not Applicable Psych Involvement: No (comment)  Admission diagnosis:  Subdural hematoma [S06.5XAA] Hyperglycemia [R73.9] Traumatic hemorrhage of cerebrum with unknown loss of consciousness status, unspecified laterality, initial encounter [S06.36AA] ICH (intracerebral hemorrhage) (HCC) [I61.9] Patient Active Problem List   Diagnosis Date Noted   Pressure injury of skin 04/17/2021   ICH (intracerebral hemorrhage) (HCC) 04/17/2021   Subdural hematoma 04/16/2021   PCP:  System, Provider Not In Pharmacy:  No Pharmacies Listed  Social Determinants of Health (SDOH) Interventions    Readmission Risk Interventions No flowsheet data found.

## 2021-04-24 NOTE — NC FL2 (Signed)
°  [place] LEVEL OF CARE SCREENING TOOL     IDENTIFICATION  Patient Name: John Gilmore Birthdate: 12-29-1930 Sex: male Admission Date (Current Location): 04/16/2021  [facility] and [place] Number:  Herbalist and Address:  [facility] [place] [address] [place], [place] [address]      Provider Number: [id number]  Attending Physician Name and Address:  Aldine Contes, MD  Relative Name and Phone Number:       Current Level of Care: Hospital Recommended Level of Care: [place] Prior Approval Number:    Date Approved/Denied:   PASRR Number: [id number]  Discharge Plan: SNF    Current Diagnoses: Patient Active Problem List   Diagnosis Date Noted   Pressure injury of skin 04/17/2021   ICH (intracerebral hemorrhage) ([place]) 04/17/2021   Subdural hematoma 04/16/2021    Orientation RESPIRATION BLADDER Height & Weight      (disoriented)  O2 ([place] 2L) Incontinent Weight: 95 lb 10.9 oz (43.4 kg) Height:  5\' 7"  (170.2 cm)  BEHAVIORAL SYMPTOMS/MOOD NEUROLOGICAL BOWEL NUTRITION STATUS      Incontinent Diet (see DC summary)  AMBULATORY STATUS COMMUNICATION OF NEEDS Skin   Extensive Assist Verbally PU Stage and Appropriate Care   PU Stage 2 Dressing:  (sacrum, foam dressing: lift every shift to assess, change PRN)                   Personal Care Assistance Level of Assistance  Bathing, Feeding, Dressing Bathing Assistance: Maximum assistance Feeding assistance: Maximum assistance Dressing Assistance: Maximum assistance     Functional Limitations Info  Sight, Hearing Sight Info: Impaired Hearing Info: Impaired      SPECIAL CARE FACTORS FREQUENCY                       Contractures Contractures Info: Not present    Additional Factors Info  Code Status, Allergies, Insulin Sliding Scale Code Status Info: DNR Allergies Info: NKA   Insulin Sliding Scale Info: see DC summary       Current  Medications (04/24/2021):  This is the current hospital active medication list Current Facility-Administered Medications  Medication Dose Route Frequency Provider Last Rate Last Admin   acetaminophen (TYLENOL) suppository 650 mg  650 mg Rectal Q4H PRN Delene Ruffini, MD   650 mg at 04/17/21 1754   chlorhexidine (PERIDEX) 0.12 % solution 15 mL  15 mL Mouth Rinse BID Charise Killian, MD   15 mL at 04/24/21 0910   insulin aspart (novoLOG) injection 0-9 Units  0-9 Units Subcutaneous TID WC Delene Ruffini, MD   2 Units at 04/24/21 1208   insulin glargine-yfgn (SEMGLEE) injection 10 Units  10 Units Subcutaneous Daily Delene Ruffini, MD   10 Units at 04/24/21 0910   lipase/protease/amylase (CREON) capsule 12,000 Units  12,000 Units Oral TID WC Delene Ruffini, MD   12,000 Units at 04/24/21 1208   MEDLINE mouth rinse  15 mL Mouth Rinse q12n4p Charise Killian, MD   15 mL at 04/24/21 1209   oxymetazoline (AFRIN) 0.05 % nasal spray 1 spray  1 spray Each Nare BID PRN Delene Ruffini, MD   1 spray at 04/22/21 1620     Discharge Medications: Please see discharge summary for a list of discharge medications.  Relevant Imaging Results:  Relevant Lab Results:   Additional Information SS#: [id number]  Geralynn Ochs, LCSW

## 2021-04-24 NOTE — Progress Notes (Signed)
9H37 [facility]) Hospital Liaison Note   Received request from Transitions of Care Manager, Lanora Manis, for hospice services at home after discharge. Chart and patient information under review by [facility] physician. Hospice eligibility approved.   Spoke with Dr.Todd Kozlowski to initiate education related to hospice philosophy, services, and team approach to care. Dr. Darnell Level verbalized understanding of information given. Per discussion, the plan is for patient to discharge home via PTAR once cleared to DC.    Please send signed and completed DNR home with patient/family. Please provide prescriptions at discharge as needed to ensure ongoing symptom management.    [facility] information and contact numbers given to family & above information shared with TOC.   Please call with any questions/concerns.    Thank you for the opportunity to participate in this patient's care.   Odette Fraction, MSW [facility] Liaison  [phone]

## 2021-04-24 NOTE — Progress Notes (Signed)
Speech Language Pathology Treatment: Dysphagia  Patient Details Name: John Gilmore MRN: [id number] DOB: Jul 26, 1930 Today's Date: 04/24/2021 Time: [id number] SLP Time Calculation (min) (ACUTE ONLY): 15 min  Assessment / Plan / Recommendation Clinical Impression  Mr. Caban was repositioned for nectar thick juice and applesauce which he readily received. Oral manipulation and transit appeared functional. Total feeding assist giving time for swallow and intermittent sub swallows with delayed throat clear minimally. He benefited from spoon presentations for nectar. Vocal quality remained clear throughout. RN tech reported he ate 95% of lunch without coughing noted. Recommend he continue Dys 1 (puree), nectar thick liquids with full supervision.    HPI HPI: Mr. Ridenhour is a 88 year old male who was admitted after several falls and with AMS.  CT head revealed large right temporal lobe parenchymal hemorrhage measuring at least 4.8 x 4.0 x 2.5 cm; right small frontal subdural hemorrhage measuring up to 4  mm. PMHx hx of advancing dementia with recent progression and admission to [facility]. Current dx includes acute hypoxic resp failure with suspected aspiration pna. Per pt's son, Mr. Krajicek had no swallowing or feeding difficulty PTA.      SLP Plan  Continue with current plan of care      Recommendations for follow up therapy are one component of a multi-disciplinary discharge planning process, led by the attending physician.  Recommendations may be updated based on patient status, additional functional criteria and insurance authorization.    Recommendations  Diet recommendations: Thin liquid;Dysphagia 1 (puree) Liquids provided via: Cup;Teaspoon Medication Administration: Crushed with puree Supervision: Staff to assist with self feeding;Full supervision/cueing for compensatory strategies Compensations: Minimize environmental distractions;Slow rate;Small sips/bites Postural  Changes and/or Swallow Maneuvers: Seated upright 90 degrees                Oral Care Recommendations: Oral care BID Follow Up Recommendations: Skilled nursing-short term rehab (<3 hours/day) Assistance recommended at discharge: Frequent or constant Supervision/Assistance SLP Visit Diagnosis: Dysphagia, unspecified (R13.10) Plan: Continue with current plan of care           Houston Siren  04/24/2021, 3:33 PM

## 2021-04-24 NOTE — Care Management Important Message (Signed)
Important Message  Patient Details  Name: John Gilmore MRN: [id number] Date of Birth: 04/19/1930   Medicare Important Message Given:  Yes     Sherilyn Banker 04/24/2021, 2:43 PM

## 2021-04-25 DIAGNOSIS — S065XAA Traumatic subdural hemorrhage with loss of consciousness status unknown, initial encounter: Secondary | ICD-10-CM | POA: Diagnosis not present

## 2021-04-25 DIAGNOSIS — R410 Disorientation, unspecified: Secondary | ICD-10-CM

## 2021-04-25 DIAGNOSIS — R739 Hyperglycemia, unspecified: Secondary | ICD-10-CM

## 2021-04-25 LAB — GLUCOSE, CAPILLARY
Glucose-Capillary: 357 mg/dL — ABNORMAL HIGH (ref 70–99)
Glucose-Capillary: 170 mg/dL — ABNORMAL HIGH (ref 70–99)
Glucose-Capillary: 273 mg/dL — ABNORMAL HIGH (ref 70–99)

## 2021-04-25 NOTE — Progress Notes (Signed)
Daily Progress Note   Patient Name: John Gilmore       Date: 04/25/2021 DOB: 19-Aug-1930  Age: 88 y.o. MRN#: [id number] Attending Physician: Earl Lagos, MD Primary Care Physician: System, Provider Not In Admit Date: 04/16/2021 Length of Stay: 8 days  Reason for Consultation/Follow-up: Establishing goals of care  HPI/Patient Profile:  pancreatic insufficiency, diabetes and dementia of 6 years with sharp decline in the last 2-3 who now resides  in [facility] who presented after several falls with altered mental status and is admitted to medicine service for management of elevated blood sugar and acute kidney injury. Also noted intraparenchymal bleed from fall, no surgical intervention.   PMT was consulted for GOC  Subjective:   Subjective: Chart Reviewed. Updates received. Patient Assessed. Created space and opportunity for patient  and family to explore thoughts and feelings regarding current medical situation.  Today's Discussion: I saw the patient at the bedside.  I spoke with the nurse as well.  No family at bedside at the time of my visit.  The patient remains confused, safety mitts on, mumbling incoherently.  However, does not look like he is in any distress or discomfort.  Patient's son is working on placement at [facility] with hospice services supporting.  Referral has been sent to hospice and have approved the patient for "home" hospice at the long-term care facility.  Review of Systems  Unable to perform ROS: Dementia   Objective:   Vital Signs:  BP 139/70 (BP Location: Left Arm)    Pulse 97    Temp 98.4 F (36.9 C) (Oral)    Resp 20    Ht 5\' 7"  (1.702 m)    Wt 43.4 kg    SpO2 99%    BMI 14.99 kg/m   Physical Exam: Physical Exam Vitals and nursing note reviewed.  Constitutional:      General: He is not in acute distress.    Appearance: He is ill-appearing. He is not toxic-appearing.  HENT:     Head: Normocephalic and atraumatic.  Pulmonary:      Effort: Pulmonary effort is normal. No respiratory distress.  Abdominal:     General: Abdomen is flat.     Palpations: Abdomen is soft.     Tenderness: There is no abdominal tenderness.  Skin:    General: Skin is warm and dry.  Neurological:     Mental Status: He is alert. He is confused.    Palliative Assessment/Data: 30%   Assessment & Plan:   Impression: Present on Admission:  Subdural hematoma  ICH (intracerebral hemorrhage) (HCC)  88 year old male with chronic dementia who just moved into a memory care unit at [facility].  After there for 2 days he had a fall and now has a subdural hematoma.  He has had a sharp decline in his mental capacity/mental status since.  He is not eating enough to support himself, AKI likely due to dehydration from poor intake.  Question of possible aspiration given frequent cough with eating.  His son is clear that they would not want a feeding tube.  They are interested in transition to long term care (prefer 02-03-1983) with hospice services.  Overall poor prognosis.  SUMMARY OF RECOMMENDATIONS   Continue attempts for placement at [facility] or comparable long-term care facility Arrange for hospice at LTC facility upon discharge Continues to treat the treatable as inpatient Remain DNR PMT will follow peripherally now that goals and discharge plan are identified Please contact [facility] for  further assistance or new needs  Symptom Management:  Per primary team BMP is available to assist as needed  Code Status: DNR  Prognosis: < 6 months  Discharge Planning: Skilled Nursing Facility with Hospice  Discussed with: Patient's sons, medical team, nursing team, [facility] team, hospice team  Thank you for allowing [place] to participate in the care of Dameir Gentzler PMT will continue to support holistically.  Billing by MDM: High Acute or chronic illness with exacerbation as threat to life, discussion of management plan with healthcare personnel (medical  team, nursing team, hospice liaison)  Wynne Dust, NP Palliative Medicine Team  Team Phone # [phone] (Nights/Weekends)  11/11/2020, 8:17 AM

## 2021-04-25 NOTE — Progress Notes (Signed)
9J09 [facility]) Hospital Liaison Note   Received request from Transitions of Care Manager, Lanora Manis, for hospice services at home after discharge. Chart and patient information under review by [facility] physician. Hospice eligibility approved.   Spoke with Dr.Todd Kulish to initiate education related to hospice philosophy, services, and team approach to care. Dr. Darnell Level verbalized understanding of information given. Per discussion, the plan is for patient to discharge home via PTAR once cleared to DC. Plan is to hopefully get placed at Long-term care side at [facility].    Please send signed and completed DNR home with patient/family. Please provide prescriptions at discharge as needed to ensure ongoing symptom management.    [facility] information and contact numbers given to family & above information shared with TOC.   Please call with any questions/concerns.    Thank you for the opportunity to participate in this patient's care.\  Yolande Jolly, BSN, [facility] [phone]

## 2021-04-25 NOTE — Progress Notes (Signed)
HD#8 SUBJECTIVE:  Patient Summary: John Gilmore is a 88 year old male with a hx of pancreatic insufficiency, diabetes and dementia of 6 years with sharp decline in the last 2-3 who now resides  in [facility] who presented after several falls with altered mental status and is admitted to medicine service for management of elevated blood sugar and acute kidney injury.  Overnight Events:  No acute events overnight.   Interm History:  Patient seen and evaluated at bedside. Awake, not responding to questioning. Does not appear to be in pain.   OBJECTIVE:  Vital Signs: Vitals:   04/24/21 2345 04/25/21 0334 04/25/21 0753 04/25/21 1111  BP: (!) 148/71 140/75 (!) 138/94 136/67  Pulse: (!) 108 98 95 100  Resp: 18 19 14 14   Temp: 98.6 F (37 C) 97.6 F (36.4 C) 97.9 F (36.6 C) 98.1 F (36.7 C)  TempSrc:  Oral Oral Oral  SpO2:      Weight:      Height:       Supplemental O2: Room Air SpO2: 95 % O2 Flow Rate (L/min): 2 L/min FiO2 (%): (!) 3.5 %  Filed Weights   04/16/21 2002  Weight: 43.4 kg     Intake/Output Summary (Last 24 hours) at 04/25/2021 1326 Last data filed at 04/25/2021 1200 Gross per 24 hour  Intake 280 ml  Output 400 ml  Net -120 ml   Net IO Since Admission: 708.99 mL [04/25/21 1326]  Physical Exam: Constitutional: chronically ill appearing male, cachectic, awake HENT: ecchymoses of right eyelid improved Neck: supple Cardiovascular: regular rate and rhythm, no m/r/g Pulmonary/Chest: normal work of breathing on 2L [place], breath sounds appreciated anteriorly and no noted rales  Abdominal: soft, non-tender, non-distended GU: skin tear unable to be assessed. Condom cath removed MSK: decreased bulk Neurological:  inconsistently responds to verbal and tactile stimuli. Moves all extremities spontaneously.   Skin: warm and dry, skin tear noted over right shin with some bleeding at site Psych: unable to assess  Patient Lines/Drains/Airways  Status     Active Line/Drains/Airways     Name Placement date Placement time Site Days   Peripheral IV 04/16/21 20 G Anterior;Distal;Upper Antecubital 04/16/21  1213  Antecubital  1   Pressure Injury 04/16/21 Sacrum Mid Stage 2 -  Partial thickness loss of dermis presenting as a shallow open injury with a red, pink wound bed without slough. pink 04/16/21  2010  -- 1            Pertinent Labs: CBC Latest Ref Rng & Units 04/23/2021 04/21/2021 04/20/2021  WBC 4.0 - 10.5 K/uL 9.0 7.9 8.4  Hemoglobin 13.0 - 17.0 g/dL 10.5(L) 10.5(L) 10.0(L)  Hematocrit 39.0 - 52.0 % 30.1(L) 31.8(L) 29.9(L)  Platelets 150 - 400 K/uL 121(L) 107(L) 103(L)    CMP Latest Ref Rng & Units 04/23/2021 04/22/2021 04/21/2021  Glucose 70 - 99 mg/dL 211(H) 289(H) 246(H)  BUN 8 - 23 mg/dL 16 17 17   Creatinine 0.61 - 1.24 mg/dL 0.85 0.86 0.96  Sodium 135 - 145 mmol/L 143 143 147(H)  Potassium 3.5 - 5.1 mmol/L 3.6 3.7 3.8  Chloride 98 - 111 mmol/L 105 106 114(H)  CO2 22 - 32 mmol/L 28 28 26   Calcium 8.9 - 10.3 mg/dL 8.3(L) 8.1(L) 8.3(L)  Total Protein 6.5 - 8.1 g/dL - - -  Total Bilirubin 0.3 - 1.2 mg/dL - - -  Alkaline Phos 38 - 126 U/L - - -  AST 15 - 41  U/L - - -  ALT 0 - 44 U/L - - -    Recent Labs    04/24/21 1110 04/24/21 1602 04/25/21 1051  GLUCAP 153* 206* 357*     Pertinent Imaging: No results found.  ASSESSMENT/PLAN:  Assessment: Principal Problem:   Subdural hematoma Active Problems:   Pressure injury of skin   ICH (intracerebral hemorrhage) (HCC)   John Gilmore is a 88 y.o. with pertinent PMH of pancreatic insufficiency, diabetes and dementia of 6 years with sharp decline in the last 2-3 who now resides  in [facility] who presented after several falls with altered mental status and is admitted to medicine service for management of blood sugar on hospital day 8  # Altered mental status # History of dementia # Intraparenchymal bleed  - No surgical intervention.  -  patients mental status continues to fluctuate. He has been unable to feed himself. Mitts in place as patient continues to pull at lines.   - GOC discussion with palliative and two sons today. Patient to be transitioned to home hospice at long term care. Family prefers [facility]. He has been approved by hospice. - Will continue with current treatments while in the hospital.   Acute hypoxic respiratory failure 2/2 suspected aspiration pneumonia Received course of CTX. Pneumonia resolved.  - continue to monitor.   Hypernatremia Resolved.   Diabetes mellitus type 2 Cotinue current basal insulin and meal time sliding scale  # Acute kidney injury - resolved.    #Pancreatic insufficiency - on creon at home.  Restarted with meals.    #Sacral wound No skin breakdown noted during bandaging. - continue to monitor, keep wound clean, dry, and intact. Frequent repositioning.   #Hx of hypertriglyceridemia - most recent LDL appears to be 133  Diet: dysphagia 1 VTE:  SCDs Code: DNR/DNI Prior to Admission Living Arrangement:  [facility] Anticipated Discharge Location: SNF with home hospice Dispo: Admit patient to Inpatient with expected length of stay greater than 2 midnights.  Signature: Delene Ruffini, PGY1 Internal Medicine  Pager [phone]   Please contact the on call pager after 5 pm and on weekends at [phone].

## 2021-04-26 LAB — GLUCOSE, CAPILLARY
Glucose-Capillary: 173 mg/dL — ABNORMAL HIGH (ref 70–99)
Glucose-Capillary: 167 mg/dL — ABNORMAL HIGH (ref 70–99)
Glucose-Capillary: 82 mg/dL (ref 70–99)

## 2021-04-26 NOTE — Progress Notes (Signed)
HD#9 SUBJECTIVE:  Patient Summary: John Gilmore is a 88 year old male with a hx of pancreatic insufficiency, diabetes and dementia of 6 years with sharp decline in the last 2-3 who now resides  in [facility] who presented after several falls with altered mental status and is admitted to medicine service for management of elevated blood sugar and acute kidney injury.  Overnight Events:  No acute events overnight.   Interm History:  Patient seen and evaluated at bedside. Awake, not responding to questions. Does not appear to be in pain.   OBJECTIVE:  Vital Signs: Vitals:   04/25/21 1504 04/25/21 1948 04/25/21 2323 04/26/21 0341  BP: 139/70 136/61 (!) 143/71 (!) 137/99  Pulse: 97 99 89 100  Resp: 20 16 16 16   Temp: 98.4 F (36.9 C) 98.8 F (37.1 C) 98 F (36.7 C) 97.7 F (36.5 C)  TempSrc: Oral Oral Oral Oral  SpO2: 99% 94% 93% 97%  Weight:      Height:       Supplemental O2: Room Air SpO2: 97 % O2 Flow Rate (L/min): 2 L/min FiO2 (%): (!) 3.5 %  Filed Weights   04/16/21 2002  Weight: 43.4 kg     Intake/Output Summary (Last 24 hours) at 04/26/2021 0935 Last data filed at 04/26/2021 [id number] Gross per 24 hour  Intake 200 ml  Output 1000 ml  Net -800 ml    Net IO Since Admission: -171.01 mL [04/26/21 0935]  Physical Exam: Constitutional: chronically ill appearing male, cachectic, awake HENT: ecchymoses of right eyelid resolved Neck: supple Cardiovascular: regular rate and rhythm, no m/r/g Pulmonary/Chest: normal work of breathing on RA, breath sounds appreciated anteriorly and no noted rales  Abdominal: soft, non-tender, non-distended GU: Condom cath removed MSK: decreased bulk Neurological:  inconsistently responds to verbal and tactile stimuli. Moves all extremities spontaneously.   Skin: warm and dry, skin tear noted over right shin Psych: unable to assess  Patient Lines/Drains/Airways Status     Active Line/Drains/Airways     Name  Placement date Placement time Site Days   Peripheral IV 04/16/21 20 G Anterior;Distal;Upper Antecubital 04/16/21  1213  Antecubital  1   Pressure Injury 04/16/21 Sacrum Mid Stage 2 -  Partial thickness loss of dermis presenting as a shallow open injury with a red, pink wound bed without slough. pink 04/16/21  2010  -- 1            Pertinent Labs: CBC Latest Ref Rng & Units 04/23/2021 04/21/2021 04/20/2021  WBC 4.0 - 10.5 K/uL 9.0 7.9 8.4  Hemoglobin 13.0 - 17.0 g/dL 10.5(L) 10.5(L) 10.0(L)  Hematocrit 39.0 - 52.0 % 30.1(L) 31.8(L) 29.9(L)  Platelets 150 - 400 K/uL 121(L) 107(L) 103(L)    CMP Latest Ref Rng & Units 04/23/2021 04/22/2021 04/21/2021  Glucose 70 - 99 mg/dL 211(H) 289(H) 246(H)  BUN 8 - 23 mg/dL 16 17 17   Creatinine 0.61 - 1.24 mg/dL 0.85 0.86 0.96  Sodium 135 - 145 mmol/L 143 143 147(H)  Potassium 3.5 - 5.1 mmol/L 3.6 3.7 3.8  Chloride 98 - 111 mmol/L 105 106 114(H)  CO2 22 - 32 mmol/L 28 28 26   Calcium 8.9 - 10.3 mg/dL 8.3(L) 8.1(L) 8.3(L)  Total Protein 6.5 - 8.1 g/dL - - -  Total Bilirubin 0.3 - 1.2 mg/dL - - -  Alkaline Phos 38 - 126 U/L - - -  AST 15 - 41 U/L - - -  ALT 0 - 44 U/L - - -  Recent Labs    04/25/21 1641 04/25/21 2139 04/26/21 0617  GLUCAP 170* 273* 173*      Pertinent Imaging: No results found.  ASSESSMENT/PLAN:  Assessment: Principal Problem:   Subdural hematoma Active Problems:   Pressure injury of skin   ICH (intracerebral hemorrhage) (HCC)   John Gilmore is a 88 y.o. with pertinent PMH of pancreatic insufficiency, diabetes and dementia of 6 years with sharp decline in the last 2-3 who now resides  in [facility] who presented after several falls with altered mental status and is admitted to medicine service for management of blood sugar on hospital day 9  # Altered mental status # History of dementia # Intraparenchymal bleed  - No surgical intervention.  - patients mental status continues to fluctuate. He has  been unable to feed himself. Mitts in place as patient continues to pull at lines.   - GOC discussion with palliative and two sons last week. Patient will be transitioned to long term care facility with hospice. Family prefers [facility]. He has been approved by insurance for hospice. SW/CM stated that patient currently does not have a bed.   Acute hypoxic respiratory failure 2/2 suspected aspiration pneumonia Received course of CTX for Pneumonia. Patient now breathing on room air.  - continue to monitor.   Hypernatremia Resolved.   Diabetes mellitus type 2 Cotinue current basal insulin and meal time sliding scale  # Acute kidney injury - resolved.    #Pancreatic insufficiency - on creon at home.  Restarted with meals.    #Sacral wound No skin breakdown noted during bandaging. - continue to monitor, keep wound clean, dry, and intact. Frequent repositioning.   #Hx of hypertriglyceridemia - most recent LDL appears to be 133  Diet: dysphagia 1 VTE:  SCDs Code: DNR/DNI Prior to Admission Living Arrangement:  [facility] Anticipated Discharge Location: long term care with home hospice Dispo: Admit patient to Inpatient with expected length of stay greater than 2 midnights.  Rick Duff, MD PGY-2 Internal Medicine  Pager [phone]  Please contact the on call pager after 5 pm and on weekends at [phone].

## 2021-04-26 NOTE — Progress Notes (Signed)
[facility] ([facility])  [facility] continues to follow for discharge disposition and will plan to begin hospice once he is discharged.  Wallis Bamberg BSN, RN [facility] Liaison

## 2021-04-27 DIAGNOSIS — S065XAA Traumatic subdural hemorrhage with loss of consciousness status unknown, initial encounter: Secondary | ICD-10-CM | POA: Diagnosis not present

## 2021-04-27 LAB — GLUCOSE, CAPILLARY
Glucose-Capillary: 85 mg/dL (ref 70–99)
Glucose-Capillary: 130 mg/dL — ABNORMAL HIGH (ref 70–99)
Glucose-Capillary: 40 mg/dL — CL (ref 70–99)
Glucose-Capillary: 77 mg/dL (ref 70–99)
Glucose-Capillary: 126 mg/dL — ABNORMAL HIGH (ref 70–99)

## 2021-04-27 NOTE — Progress Notes (Signed)
HD#10 SUBJECTIVE:  Patient Summary: John Gilmore is a 88 year old male with a hx of pancreatic insufficiency, diabetes and dementia of 6 years with sharp decline in the last 2-3 who now resides  in [facility] who presented after several falls with altered mental status and is admitted to medicine service for management of elevated blood sugar and acute kidney injury.  Overnight Events:  No acute events overnight.   Interm History:  Patient seen and evaluated at bedside. Opens eyes to tactile stimuli. Does not respond. Does not appear to be in pain.   OBJECTIVE:  Vital Signs: Vitals:   04/26/21 2001 04/26/21 2324 04/27/21 0443 04/27/21 0745  BP: 132/62 108/89 118/67 128/67  Pulse: 99 90 83 62  Resp: 20 20 18 20   Temp: (!) 97.4 F (36.3 C) 98.2 F (36.8 C) 98 F (36.7 C) 97.7 F (36.5 C)  TempSrc:  Oral Oral   SpO2: 93% 94% 97% 96%  Weight:      Height:       Supplemental O2: Room Air SpO2: 96 % O2 Flow Rate (L/min): 2 L/min FiO2 (%): (!) 3.5 %  Filed Weights   04/16/21 2002  Weight: 43.4 kg     Intake/Output Summary (Last 24 hours) at 04/27/2021 1043 Last data filed at 04/27/2021 0600 Gross per 24 hour  Intake --  Output 800 ml  Net -800 ml   Net IO Since Admission: -971.01 mL [04/27/21 1043]  Physical Exam: Constitutional: chronically ill appearing male, cachectic, awake HENT: ecchymoses of right eyelid resolved Neck: supple Cardiovascular: regular rate and rhythm, no m/r/g Pulmonary/Chest: normal work of breathing on RA, breath sounds appreciated anteriorly and no noted rales  Abdominal: soft, non-tender, non-distended GU: Condom cath removed MSK: decreased bulk Neurological:  inconsistently responds to verbal and tactile stimuli. Moves all extremities spontaneously.   Skin: warm and dry, skin tear noted over right shin Psych: unable to assess  Patient Lines/Drains/Airways Status     Active Line/Drains/Airways     Name  Placement date Placement time Site Days   Peripheral IV 04/16/21 20 G Anterior;Distal;Upper Antecubital 04/16/21  1213  Antecubital  1   Pressure Injury 04/16/21 Sacrum Mid Stage 2 -  Partial thickness loss of dermis presenting as a shallow open injury with a red, pink wound bed without slough. pink 04/16/21  2010  -- 1            Pertinent Labs: CBC Latest Ref Rng & Units 04/23/2021 04/21/2021 04/20/2021  WBC 4.0 - 10.5 K/uL 9.0 7.9 8.4  Hemoglobin 13.0 - 17.0 g/dL 10.5(L) 10.5(L) 10.0(L)  Hematocrit 39.0 - 52.0 % 30.1(L) 31.8(L) 29.9(L)  Platelets 150 - 400 K/uL 121(L) 107(L) 103(L)    CMP Latest Ref Rng & Units 04/23/2021 04/22/2021 04/21/2021  Glucose 70 - 99 mg/dL 211(H) 289(H) 246(H)  BUN 8 - 23 mg/dL 16 17 17   Creatinine 0.61 - 1.24 mg/dL 0.85 0.86 0.96  Sodium 135 - 145 mmol/L 143 143 147(H)  Potassium 3.5 - 5.1 mmol/L 3.6 3.7 3.8  Chloride 98 - 111 mmol/L 105 106 114(H)  CO2 22 - 32 mmol/L 28 28 26   Calcium 8.9 - 10.3 mg/dL 8.3(L) 8.1(L) 8.3(L)  Total Protein 6.5 - 8.1 g/dL - - -  Total Bilirubin 0.3 - 1.2 mg/dL - - -  Alkaline Phos 38 - 126 U/L - - -  AST 15 - 41 U/L - - -  ALT 0 - 44 U/L - - -  Recent Labs    04/26/21 1655 04/26/21 2125 04/27/21 0611  GLUCAP 167* 82 85     Pertinent Imaging: No results found.  ASSESSMENT/PLAN:  Assessment: Principal Problem:   Subdural hematoma Active Problems:   Pressure injury of skin   ICH (intracerebral hemorrhage) (HCC)   John Gilmore is a 88 y.o. with pertinent PMH of pancreatic insufficiency, diabetes and dementia of 6 years with sharp decline in the last 2-3 who now resides  in [facility] who presented after several falls with altered mental status and is admitted to medicine service for management of blood sugar on hospital day 10  # Altered mental status # History of dementia # Intraparenchymal bleed  - No surgical intervention.  - patients mental status has waned past several days. He has  been unable to feed himself. Mitts in place as patient continues to pull at lines.   - GOC discussion with palliative and two sons last week. Patient will be transitioned to long term care facility with hospice capabilities. Family prefers [facility]. He has been approved by insurance for hospice. Anticipated bed availability tomorrow. Likely D/C tomorrow.   Acute hypoxic respiratory failure 2/2 suspected aspiration pneumonia Received course of CTX for Pneumonia. Patient now breathing on room air.  - continue to monitor.   Hypernatremia Resolved.   Diabetes mellitus type 2 Cotinue current basal insulin and meal time sliding scale  # Acute kidney injury - resolved.    #Pancreatic insufficiency - on creon at home.  Restarted with meals.    #Sacral wound No skin breakdown noted during bandaging. - continue to monitor, keep wound clean, dry, and intact. Frequent repositioning.   #Hx of hypertriglyceridemia - most recent LDL appears to be 133  Diet: dysphagia 1 VTE:  SCDs Code: DNR/DNI Prior to Admission Living Arrangement:  [facility] Anticipated Discharge Location: long term care with home hospice Dispo: Admit patient to Inpatient with expected length of stay greater than 2 midnights.  Delene Ruffini, PGY1 Internal Medicine  Pager [phone]  Please contact the on call pager after 5 pm and on weekends at [phone].

## 2021-04-27 NOTE — TOC Progression Note (Signed)
Transition of Care [facility]) - Progression Note    Patient Details  Name: John Gilmore MRN: [id number] Date of Birth: 1931-02-07  Transition of [facility]) CM/SW Contact  Baldemar Lenis, [place] Phone Number: 04/27/2021, 1:16 PM  Clinical Narrative:   CSW notified by [facility] that they will have a bed available for patient tomorrow. CSW updated MD. CSW to follow.    Expected Discharge Plan: Skilled Nursing Facility Barriers to Discharge: Continued Medical Work up, Awaiting State Approval Financial controller)  Expected Discharge Plan and Services Expected Discharge Plan: Skilled Nursing Facility     Post Acute Care Choice: Skilled Nursing Facility Living arrangements for the past 2 months: Assisted Living Facility                                       Social Determinants of Health (SDOH) Interventions    Readmission Risk Interventions No flowsheet data found.

## 2021-04-27 NOTE — Progress Notes (Signed)
[employer] [facility] [employer] ([facility])Hospital Liaison Note   [facility] continues to follow for discharge disposition and will plan to begin hospice once he is discharged.   [facility] Liaison  [phone]

## 2021-04-28 DIAGNOSIS — Z515 Encounter for palliative care: Secondary | ICD-10-CM

## 2021-04-28 LAB — GLUCOSE, CAPILLARY
Glucose-Capillary: 66 mg/dL — ABNORMAL LOW (ref 70–99)
Glucose-Capillary: 118 mg/dL — ABNORMAL HIGH (ref 70–99)
Glucose-Capillary: 207 mg/dL — ABNORMAL HIGH (ref 70–99)
Glucose-Capillary: 105 mg/dL — ABNORMAL HIGH (ref 70–99)

## 2021-04-28 MED ORDER — ACETAMINOPHEN 650 MG RE SUPP: 650.0000 mg | RECTAL | 0 refills | Status: AC | PRN

## 2021-04-28 MED ORDER — OXYCODONE HCL 5 MG PO TABS: 2.5000 mg | ORAL_TABLET | ORAL | 0 refills | Status: AC | PRN

## 2021-04-28 MED ORDER — ORAL CARE MOUTH RINSE: 15.0000 mL | Freq: Two times a day (BID) | OROMUCOSAL | 0 refills | Status: AC

## 2021-04-28 MED ORDER — CHLORHEXIDINE GLUCONATE 0.12 % MT SOLN
15.0000 mL | Freq: Two times a day (BID) | OROMUCOSAL | 0 refills | Status: AC
Start: 2021-04-28 — End: ?

## 2021-04-28 NOTE — Progress Notes (Signed)
Inpatient Diabetes Program Recommendations  AACE/ADA: New Consensus Statement on Inpatient Glycemic Control (2015)  Target Ranges:  Prepandial:   less than 140 mg/dL      Peak postprandial:   less than 180 mg/dL (1-2 hours)      Critically ill patients:  140 - 180 mg/dL   Lab Results  Component Value Date   GLUCAP 207 (H) 04/28/2021   HGBA1C 8.7 (H) 04/16/2021    Review of Glycemic Control  Latest Reference Range & Units 04/27/21 06:11 04/27/21 11:27 04/27/21 16:38 04/27/21 17:43 04/27/21 21:11 04/28/21 06:09 04/28/21 06:51 04/28/21 11:25  Glucose-Capillary 70 - 99 mg/dL 85 329 (H) 40 (LL) 77 191 (H) 66 (L) 118 (H) 207 (H)  (LL): Data is critically low (H): Data is abnormally high (L): Data is abnormally low  Diabetes history: DM2 Outpatient Diabetes medications: Jardiance 10 QD, Metformin 1000 BID  Current orders for Inpatient glycemic control: Semglee 10 units qd, Novolog 0-9 units tid correction  Inpatient Diabetes Program Recommendations:   -Decrease Semglee to 6 units -Decrease Novolog correction to 0-6 units tid Secure chat to Dr. Heide Spark.  Thank you, John Gilmore. Kwane Rohl, RN, MSN, CDE  Diabetes Coordinator Inpatient Glycemic Control Team Team Pager [phone] (8am-5pm) 04/28/2021 3:08 PM

## 2021-04-28 NOTE — Discharge Summary (Addendum)
Name: John Gilmore MRN: [id number] DOB: [birth year] 88 y.o. PCP: System, Provider Not In  Date of Admission: 04/16/2021 12:03 PM Date of Discharge: 04/28/21 Attending Physician: Dr. Jimmye Norman  Discharge Diagnosis: Principal Problem:   Subdural hematoma Active Problems:   Pressure injury of skin   ICH (intracerebral hemorrhage) [facility])   Hospice care patient    Discharge Medications: Allergies as of 04/28/2021   No Known Allergies      Medication List     STOP taking these medications    galantamine 16 MG 24 hr capsule Commonly known as: RAZADYNE ER   Jardiance 10 MG Tabs tablet Generic drug: empagliflozin   memantine 10 MG tablet Commonly known as: NAMENDA   metFORMIN 1000 MG tablet Commonly known as: GLUCOPHAGE   multivitamin tablet   Vitamin D 50 MCG (2000 UT) Caps       TAKE these medications    acetaminophen 650 MG suppository Commonly known as: TYLENOL Place 1 suppository (650 mg total) rectally every 4 (four) hours as needed for fever.   chlorhexidine 0.12 % solution Commonly known as: PERIDEX 15 mLs by Mouth Rinse route 2 (two) times daily.   divalproex 125 MG DR tablet Commonly known as: DEPAKOTE Take 125 mg by mouth at bedtime.   lipase/protease/amylase 36000 UNITS Cpep capsule Commonly known as: CREON Take 72,000 Units by mouth 3 (three) times daily with meals.   lipase/protease/amylase 36000 UNITS Cpep capsule Commonly known as: CREON Take 1 capsule by mouth 2 (two) times daily. With Snacks 1400 & 2100   mouth rinse Liqd solution 15 mLs by Mouth Rinse route 2 times daily at 12 noon and 4 pm.   oxyCODONE 5 MG immediate release tablet Commonly known as: Roxicodone Take 0.5 tablets (2.5 mg total) by mouth every 4 (four) hours as needed for severe pain.   Simply Saline 0.9 % Aers Generic drug: Saline Place 1 spray into the nose every hour as needed (dryness/ nosebleed).               Discharge Care Instructions  (From  admission, onward)           Start     Ordered   04/28/21 0000  Change dressing (specify)       Comments: Keep wound clean dry and intact.   04/28/21 1321            Disposition and follow-up:   John [facility] was discharged from [facility] condition.  At the [facility] follow up visit please address:  1.  Follow-up:  a. None    2.  Labs / imaging needed at time of follow-up: none  3.  Pending labs/ test needing follow-up: none  4.  Medication Changes  Started:  Stopped:   Changed:   Follow-up Appointments:  Contact information for after-discharge care     Destination     [facility] .   Service: Skilled Nursing Contact information: [address] [place] [facility] Course by problem list:   Patient is an 88 year old male who [facility] after a series of falls and subsequent alteration in his mentation. . During evaluation in [facility] he was found to have bilateral intraparenchymal hemorrhages as well as a subdural hematoma. ED physician discussed  case with neurosurgery who fel that the patient would not be a candidate for surgery. He was admitted to medicine service for observation iof his mental status and management of his diabetes. Patient was initially not responsive to tactile or verbal stimuli with no corneal reflex in the ED. Although he did demonstrate some improvement over the following several days, he did not return to his baseline. It was felt that his ability to feed himself would be an important indicator as to whether to proceed with hospice as patient's son did not want to proceed with feeding tube. His intake and output and meal consumption was closely monitored during his stay. Unfortunately he remained unable to feed himself and meal consumption was less than  ideal.  Palliative was consulted and discussed with family. It was felt that although he had made some improvements since admission, his mental status improvement had plateaud. The decision to transition the patient to SNF with hospice capabilities was chosen. He continued to receive care while inpatient.    Diabetes - patients diabetes regimen was held in admission due to poor po intake. His blood sugar was closely monitored and insulin was added back as he began to take in some food.   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 This medication was initially help due to poor PO intake. It was restarted at low dose as he began taking in more by mouth.   Discharge Subjective: Patient seen and evaluated at bedside. Awakens to physical stimuli but does not respond. Does not appear to be in pain.   Discharge Exam:   BP 107/80 (BP Location: Right Arm)    Pulse 94    Temp 98 F (36.7 C) (Oral)    Resp 14    Ht 5\' 7"  (1.702 m)    Wt 43.4 kg    SpO2 99%    BMI 14.99 kg/m  Constitutional: chronically ill appearing male, cachectic, awake HENT: ecchymoses of right eyelid  Neck: supple Cardiovascular: regular rate and rhythm, no m/r/g Pulmonary/Chest: normal work of breathing on RA, breath sounds appreciated anteriorly and no noted rales  Abdominal: soft, non-tender, non-distended GU: Condom cath removed MSK: decreased bulk Neurological:  inconsistently responds to verbal and tactile stimuli. Moves all extremities spontaneously.   Skin: warm and dry, skin tear noted over right shin, no skin break down, bogginess, or non-blanching erythema of heels.  Psych: unable to assess  Pertinent Labs, Studies, and Procedures:  CBC Latest Ref Rng & Units 04/23/2021 04/21/2021 04/20/2021  WBC 4.0 - 10.5 K/uL 9.0 7.9 8.4  Hemoglobin 13.0 - 17.0 g/dL 10.5(L) 10.5(L) 10.0(L)  Hematocrit 39.0 - 52.0 % 30.1(L) 31.8(L) 29.9(L)  Platelets 150 - 400 K/uL 121(L) 107(L) 103(L)    CMP Latest Ref Rng &  Units 04/23/2021 04/22/2021 04/21/2021  Glucose 70 - 99 mg/dL 211(H) 289(H) 246(H)  BUN 8 - 23 mg/dL 16 17 17   Creatinine 0.61 - 1.24 mg/dL 0.85 0.86 0.96  Sodium 135 - 145 mmol/L 143 143 147(H)  Potassium 3.5 - 5.1 mmol/L 3.6 3.7 3.8  Chloride 98 - 111 mmol/L 105 106 114(H)  CO2 22 - 32 mmol/L 28 28 26   Calcium 8.9 - 10.3 mg/dL 8.3(L) 8.1(L) 8.3(L)  Total Protein 6.5 - 8.1 g/dL - - -  Total Bilirubin 0.3 - 1.2 mg/dL - - -  Alkaline Phos 38 - 126 U/L - - -  AST 15 - 41 U/L - - -  ALT 0 - 44 U/L - - -  CT Head Wo Contrast  Result Date: 04/16/2021 CLINICAL DATA:  Head trauma, fall EXAM: CT HEAD WITHOUT CONTRAST TECHNIQUE: Contiguous axial images were obtained from the base of the skull through the vertex without intravenous contrast. RADIATION DOSE REDUCTION: This exam was performed according to the departmental dose-optimization program which includes automated exposure control, adjustment of the mA and/or kV according to patient size and/or use of iterative reconstruction technique. COMPARISON:  None. FINDINGS: Brain: There is a large right temporal lobe parenchymal hemorrhage measuring at least 4.8 x 4.0 x 2.5 cm with mass effect on the right temporal horn. No evidence of transtentorial herniation. There is also trace amount of blood adjacent to the right falx cerebelli. Multiple cerebral contusions/parenchymal hemorrhages for example in the left anterior temporal lobe, right anterior temporal lobe, right frontal lobe,. There is also small intraparenchymal hemorrhage in the left parietal lobe adjacent to the central sulcus. There is also small area of contusion/hemorrhage in the superior aspect of the right frontal lobe (axial image 28). There is mass effect on the anterior horn of the right lateral ventricle, which is likely secondary to edema due to large right temporal lobe hemorrhage. No midline shift or subfalcine herniation. There is also small frontal subdural hemorrhage measuring up to 4 mm.  Vascular: No hyperdense vessel or unexpected calcification. Skull: Normal. Negative for fracture or focal lesion. Sinuses/Orbits: Mucosal thickening of bilateral maxillary sinuses. No evidence of fracture. Other: None. IMPRESSION: 1. Multiple bilateral parenchymal hemorrhage, the largest in the right temporal lobe measuring at least 4.8 x 4.0 x 2.5 cm with surrounding edema and mass effect on the right temporal horn, without evidence of transtentorial herniation. 2.  Small right frontal subdural hemorrhage measuring up to 4 mm. 3.  No evidence of calvarial fracture. Above findings were reported to Dr. Melina Copa at approximately 1:30 p.m. on 04/16/2021. Electronically Signed   By: Keane Police D.O.   On: 04/16/2021 14:03   CT Cervical Spine Wo Contrast  Result Date: 04/16/2021 CLINICAL DATA:  Head trauma, fall EXAM: CT HEAD WITHOUT CONTRAST TECHNIQUE: Contiguous axial images were obtained from the base of the skull through the vertex without intravenous contrast. RADIATION DOSE REDUCTION: This exam was performed according to the departmental dose-optimization program which includes automated exposure control, adjustment of the mA and/or kV according to patient size and/or use of iterative reconstruction technique. COMPARISON:  None. FINDINGS: Brain: There is a large right temporal lobe parenchymal hemorrhage measuring at least 4.8 x 4.0 x 2.5 cm with mass effect on the right temporal horn. No evidence of transtentorial herniation. There is also trace amount of blood adjacent to the right falx cerebelli. Multiple cerebral contusions/parenchymal hemorrhages for example in the left anterior temporal lobe, right anterior temporal lobe, right frontal lobe,. There is also small intraparenchymal hemorrhage in the left parietal lobe adjacent to the central sulcus. There is also small area of contusion/hemorrhage in the superior aspect of the right frontal lobe (axial image 28). There is mass effect on the anterior horn of  the right lateral ventricle, which is likely secondary to edema due to large right temporal lobe hemorrhage. No midline shift or subfalcine herniation. There is also small frontal subdural hemorrhage measuring up to 4 mm. Vascular: No hyperdense vessel or unexpected calcification. Skull: Normal. Negative for fracture or focal lesion. Sinuses/Orbits: Mucosal thickening of bilateral maxillary sinuses. No evidence of fracture. Other: None. IMPRESSION: 1. Multiple bilateral parenchymal hemorrhage, the largest in the right temporal lobe measuring at least 4.8 x 4.0  x 2.5 cm with surrounding edema and mass effect on the right temporal horn, without evidence of transtentorial herniation. 2.  Small right frontal subdural hemorrhage measuring up to 4 mm. 3.  No evidence of calvarial fracture. Above findings were reported to Dr. Melina Copa at approximately 1:30 p.m. on 04/16/2021. Electronically Signed   By: Keane Police D.O.   On: 04/16/2021 14:03   DG CHEST PORT 1 VIEW  Result Date: 04/17/2021 CLINICAL DATA:  Subdural hematoma, fever EXAM: PORTABLE CHEST 1 VIEW COMPARISON:  None. FINDINGS: Patchy right lower lobe airspace disease concerning for pneumonia. No pleural effusion or pneumothorax. Heart and mediastinal contours are unremarkable. No acute osseous abnormality. IMPRESSION: 1. Patchy right lower lobe airspace disease concerning for pneumonia. Electronically Signed   By: Kathreen Devoid M.D.   On: 04/17/2021 17:52   CT Maxillofacial WO CM  Result Date: 04/16/2021 CLINICAL DATA:  Facial trauma, blunt EXAM: CT MAXILLOFACIAL WITHOUT CONTRAST TECHNIQUE: Multidetector CT imaging of the maxillofacial structures was performed. Multiplanar CT image reconstructions were also generated. RADIATION DOSE REDUCTION: This exam was performed according to the departmental dose-optimization program which includes automated exposure control, adjustment of the mA and/or kV according to patient size and/or use of iterative reconstruction  technique. COMPARISON:  None. FINDINGS: Osseous: No fracture or mandibular dislocation. No destructive process. Orbits: Negative. No traumatic or inflammatory finding. Sinuses: Scattered paranasal sinus mucosal thickening. Soft tissues: Negative. Limited intracranial: Intracranial hemorrhage is noted and partially visualized, better seen and described on separately dictated head CT. IMPRESSION: No acute facial fracture. Acute intracranial hemorrhage, see separately dictated head CT. Electronically Signed   By: Maurine Simmering M.D.   On: 04/16/2021 13:41     Discharge Instructions: Discharge Instructions     Change dressing (specify)   Complete by: As directed    Keep wound clean dry and intact.       Signed: Delene Ruffini, MD 04/28/2021, 1:21 PM   Pager: [phone]

## 2021-04-28 NOTE — Progress Notes (Signed)
PT Cancellation Note  Patient Details Name: John [facility] MRN: [id number] DOB: [birth year]   Cancelled Treatment:    Reason Eval/Treat Not Completed: PT screened, no needs identified, will sign off - transition to hospice, PT to sign off. Please reconsult as needed.   Stacie Glaze, PT DPT Acute Rehabilitation Services Pager [phone]  Office [phone]    Louis Matte 04/28/2021, 9:13 AM

## 2021-04-28 NOTE — Discharge Instructions (Addendum)
Ensure patient is comfortable. Control pain, air hunger, and anxiety.

## 2021-04-28 NOTE — Progress Notes (Signed)
Report called to [facility] and given to [place]

## 2021-04-28 NOTE — TOC Transition Note (Signed)
Transition of Care [facility]) - CM/SW Discharge Note   Patient Details  Name: Melchor Kirchgessner MRN: [id number] Date of Birth: Jan 10, 1931  Transition of Care [facility]) CM/SW Contact:  Baldemar Lenis, LCSW Phone Number: 04/28/2021, 1:37 PM   Clinical Narrative:   CSW sent discharge orders and summary to [facility], setup transport with [facility] for after 2:00 PM. No other needs identified.  Nurse to call report to [facility] at [phone], Room 110B.    Final next level of care: Skilled Nursing Facility Barriers to Discharge: Barriers Resolved   Patient Goals and CMS Choice Patient states their goals for this hospitalization and ongoing recovery are:: patient unable to participate in goal setting, not oriented CMS Medicare.gov Compare Post Acute Care list provided to:: Patient Represenative (must comment) Choice offered to / list presented to : Adult Children  Discharge Placement              Patient chooses bed at: [facility] Patient to be transferred to facility by: [facility] Name of family member notified: Todd Patient and family notified of of transfer: 04/28/21  Discharge Plan and Services     Post Acute Care Choice: Skilled Nursing Facility                               Social Determinants of Health (SDOH) Interventions     Readmission Risk Interventions No flowsheet data found.

## 2021-04-28 NOTE — Progress Notes (Signed)
SLP Cancellation Note  Patient Details Name: Darrick Greenlaw MRN: [id number] DOB: 15-May-1930   Cancelled treatment: Mr. Dinino has transitioned to a comfort approach to care and plan is to D/C to SNF for hospice care .  SLP service will respectfully sign off.  Gracyn Allor L. Samson Frederic, MA CCC/SLP Acute Rehabilitation Services Office number [phone] Pager [phone]        Blenda Mounts Laurice 04/28/2021, 10:49 AM

## 2021-04-28 NOTE — Plan of Care (Signed)
°  Problem: Education: Goal: Knowledge of General Education information will improve Description: Including pain rating scale, medication(s)/side effects and non-pharmacologic comfort measures Outcome: Adequate for Discharge   Problem: Health Behavior/Discharge Planning: Goal: Ability to manage health-related needs will improve Outcome: Adequate for Discharge   Problem: Clinical Measurements: Goal: Ability to maintain clinical measurements within normal limits will improve Outcome: Adequate for Discharge Goal: Will remain free from infection Outcome: Adequate for Discharge Goal: Diagnostic test results will improve Outcome: Adequate for Discharge Goal: Respiratory complications will improve Outcome: Adequate for Discharge Goal: Cardiovascular complication will be avoided Outcome: Adequate for Discharge   Problem: Nutrition: Goal: Adequate nutrition will be maintained Outcome: Adequate for Discharge   Problem: Coping: Goal: Level of anxiety will decrease Outcome: Adequate for Discharge   Problem: Safety: Goal: Ability to remain free from injury will improve Outcome: Adequate for Discharge   Problem: Skin Integrity: Goal: Risk for impaired skin integrity will decrease Outcome: Adequate for Discharge   Problem: Skin Integrity: Goal: Risk for impaired skin integrity will decrease Outcome: Adequate for Discharge

## 2021-04-28 NOTE — TOC Progression Note (Signed)
Transition of Care [facility]) - Progression Note    Patient Details  Name: John [facility] MRN: [id number] Date of Birth: 02/19/1931  Transition of Care [facility]) CM/SW Contact  Baldemar Lenis, [place] Phone Number: 04/28/2021, 11:10 AM  Clinical Narrative:   CSW alerted by [facility] that bed will be available for patient at 1:00 today. CSW notified MD, awaiting discharge. CSW to follow.    Expected Discharge Plan: Skilled Nursing Facility Barriers to Discharge: SNF Pending discharge summary, SNF Pending discharge orders  Expected Discharge Plan and Services Expected Discharge Plan: Skilled Nursing Facility     Post Acute Care Choice: Skilled Nursing Facility Living arrangements for the past 2 months: Assisted Living Facility                                       Social Determinants of Health (SDOH) Interventions    Readmission Risk Interventions No flowsheet data found.

## 2022-07-21 IMAGING — CT CT HEAD W/O CM
4 series · 15 of 47 positions shown, 17 images · non-contrast
Comparison: None.

CLINICAL DATA: Head trauma, fall



[Series 3: head wo · axial · 0.46mm/px · z∈[-134,-14]mm · 7 of 33 slices shown, 9 images]
[im 5/33  brain]
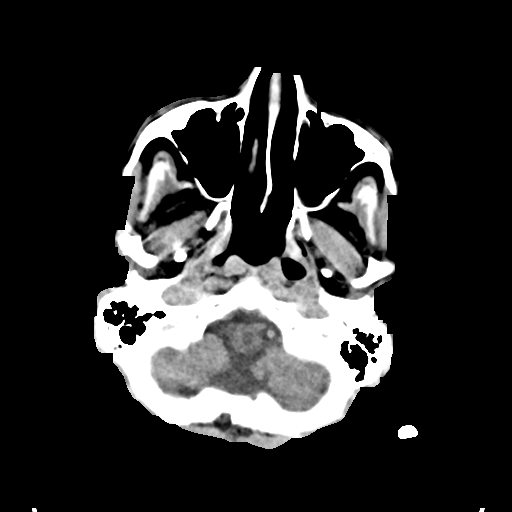
[im 5/33  bone]
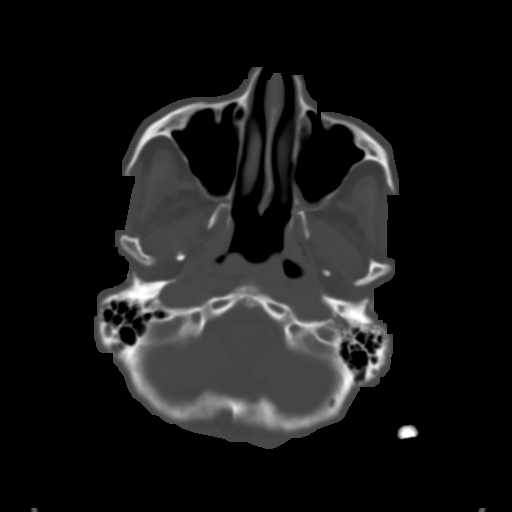
[im 9/33  brain]
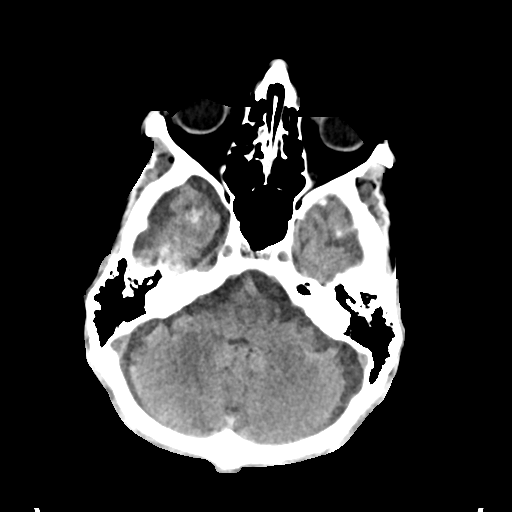
[im 13/33  brain]
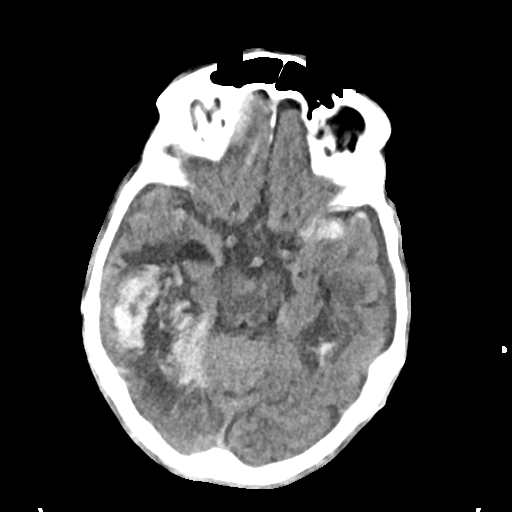
[im 17/33  brain]
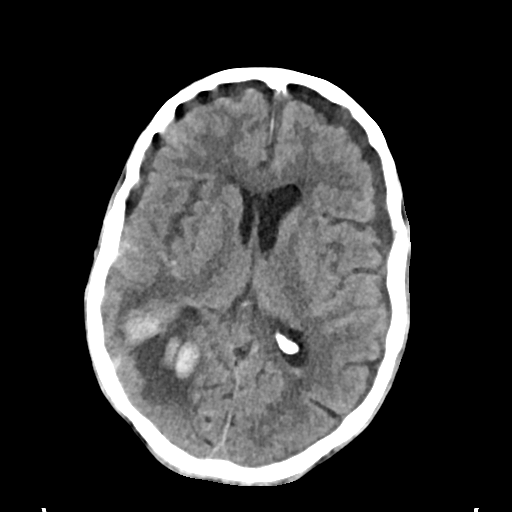
[im 21/33  brain]
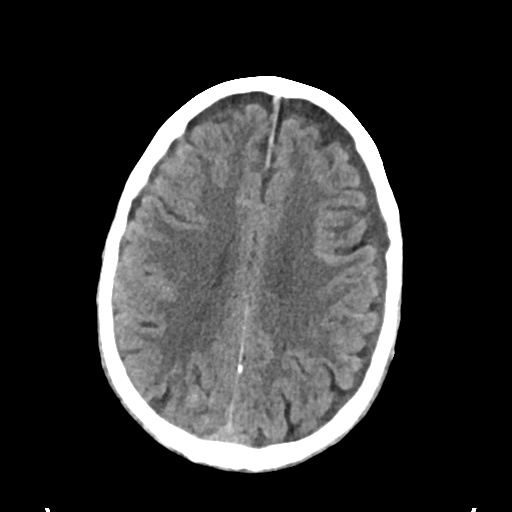
[im 21/33  bone]
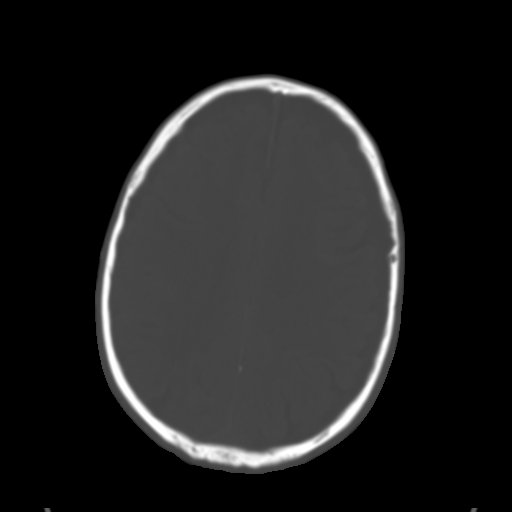
[im 25/33  brain]
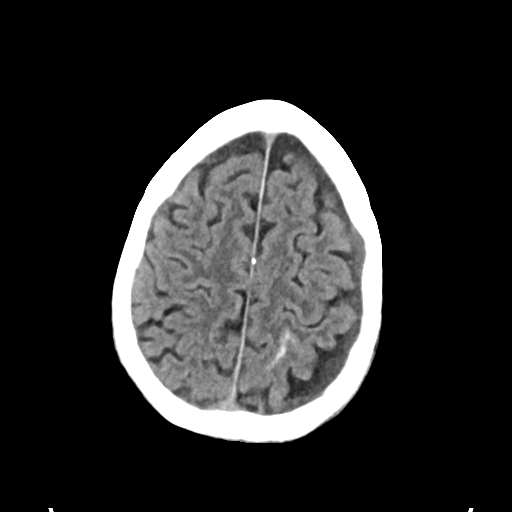
[im 29/33  brain]
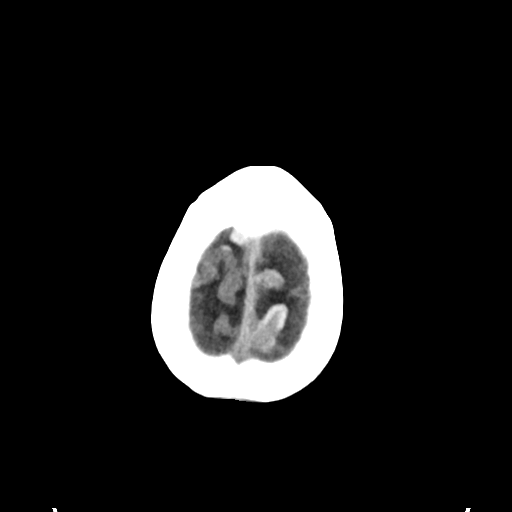

[Series 4: head bone · axial · 0.46mm/px · z∈[-138,-122]mm · 2 of 81 slices shown]
[im 9/81  bone]
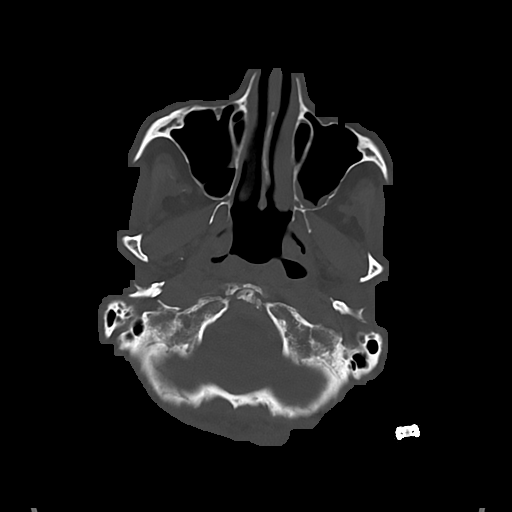
[im 17/81  bone]
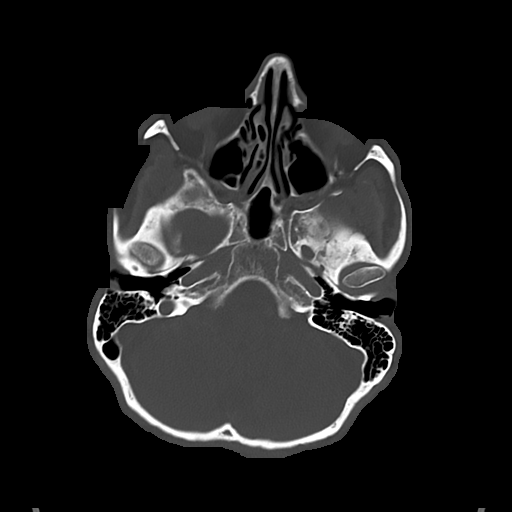

[Series 5: cor soft · coronal · 0.36mm/px · 3 of 75 slices shown]
[im 25/75  brain]
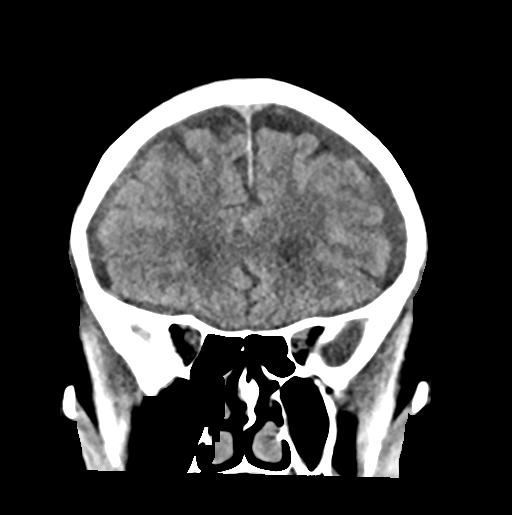
[im 33/75  brain]
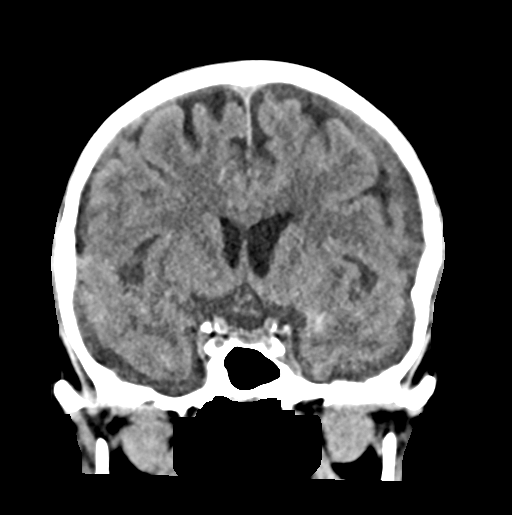
[im 42/75  brain]
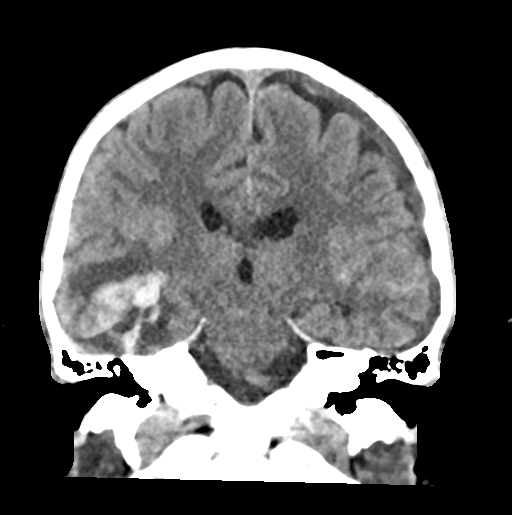

[Series 6: sag soft · sagittal · 0.35mm/px · 3 of 60 slices shown]
[im 20/60  brain]
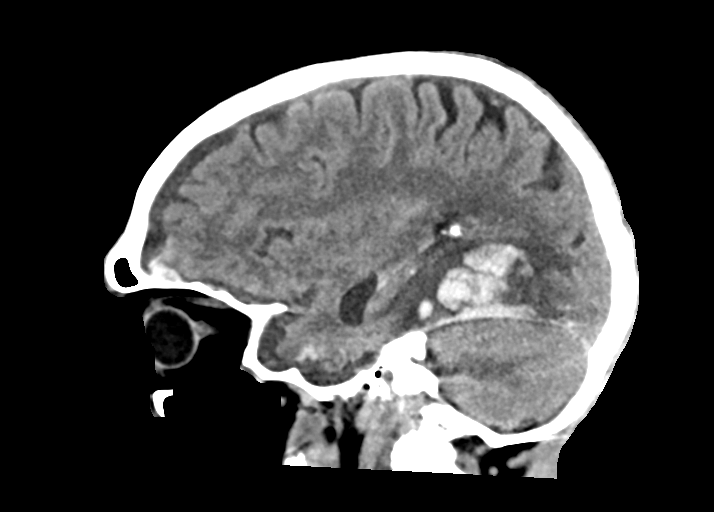
[im 30/60  brain]
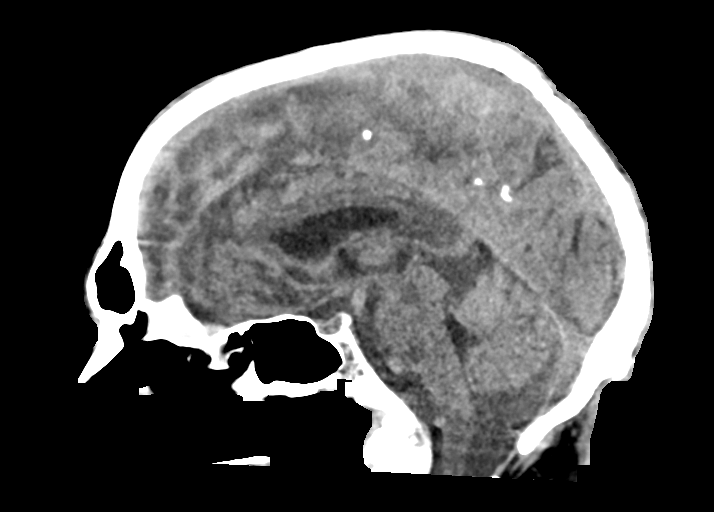
[im 40/60  brain]
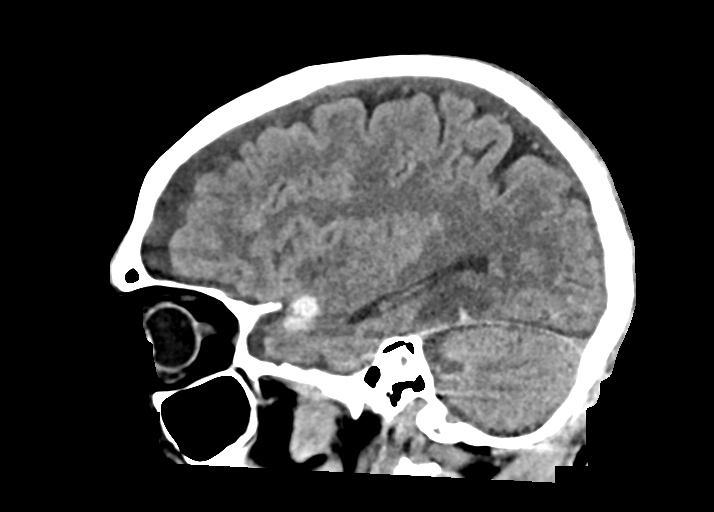

[15 of 47 positions shown; findings below may reference images not displayed]

FINDINGS: Brain: There is a large right temporal lobe parenchymal hemorrhage
measuring at least 4.8 x 4.0 x 2.5 cm with mass effect on the right
temporal horn. No evidence of transtentorial herniation. There is
also trace amount of blood adjacent to the right falx cerebelli.
Multiple cerebral contusions/parenchymal hemorrhages for example in
the left anterior temporal lobe, right anterior temporal lobe, right
frontal lobe,. There is also small intraparenchymal hemorrhage in
the left parietal lobe adjacent to the central sulcus. There is also
small area of contusion/hemorrhage in the superior aspect of the
right frontal lobe (axial image 28). There is mass effect on the
anterior horn of the right lateral ventricle, which is likely
secondary to edema due to large right temporal lobe hemorrhage. No
midline shift or subfalcine herniation.

There is also small frontal subdural hemorrhage measuring up to 4
mm.

Vascular: No hyperdense vessel or unexpected calcification.

Skull: Normal. Negative for fracture or focal lesion.

Sinuses/Orbits: Mucosal thickening of bilateral maxillary sinuses.
No evidence of fracture.

Other: None.
IMPRESSION: 1. Multiple bilateral parenchymal hemorrhage, the largest in the
right temporal lobe measuring at least 4.8 x 4.0 x 2.5 cm with
surrounding edema and mass effect on the right temporal horn,
without evidence of transtentorial herniation.

2.  Small right frontal subdural hemorrhage measuring up to 4 mm.

3.  No evidence of calvarial fracture.

Above findings were reported to Dr. Doughty at approximately [DATE]
p.m. on 04/16/2021.

## 2022-07-21 IMAGING — CT CT CERVICAL SPINE W/O CM
2 series · 9 of 27 positions shown, 11 images · non-contrast
Comparison: None.

CLINICAL DATA: Head trauma, fall



[Series 4: c spine soft · axial · 0.36mm/px · z∈[-255,-131]mm · 4 of 83 slices shown, 5 images]
[im 13/83  soft-tissue]
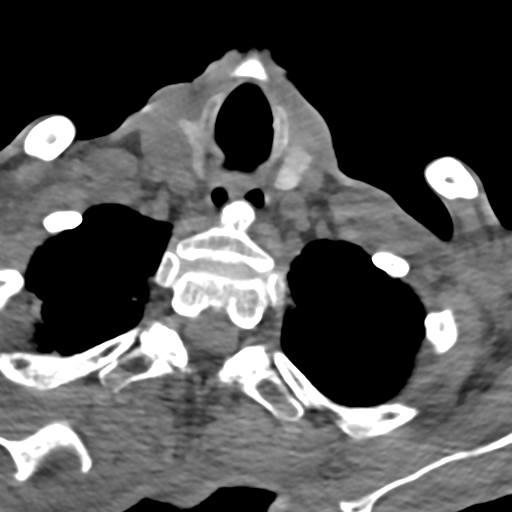
[im 13/83  bone]
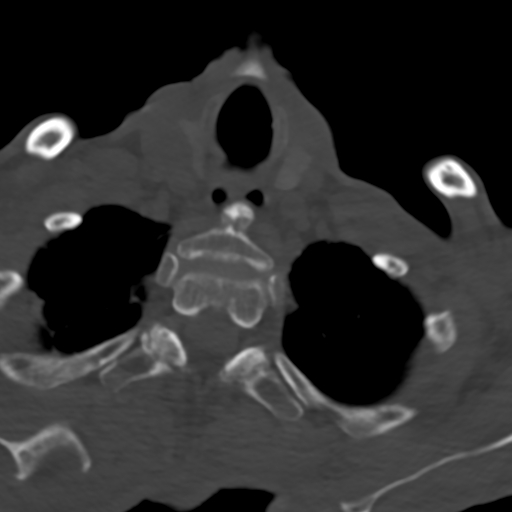
[im 32/83  bone]
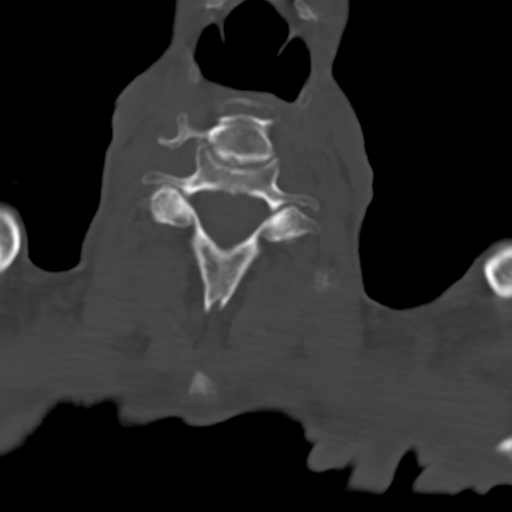
[im 51/83  bone]
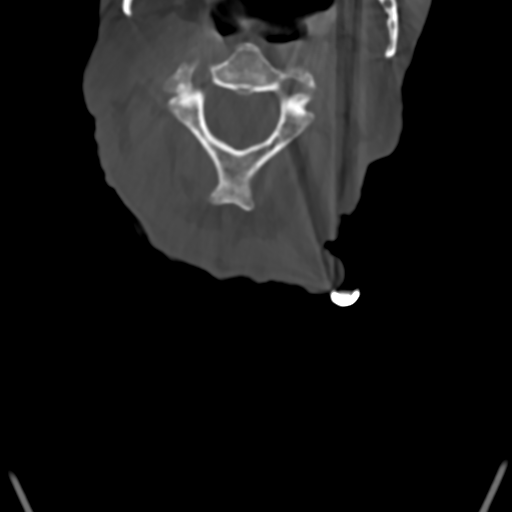
[im 70/83  bone]
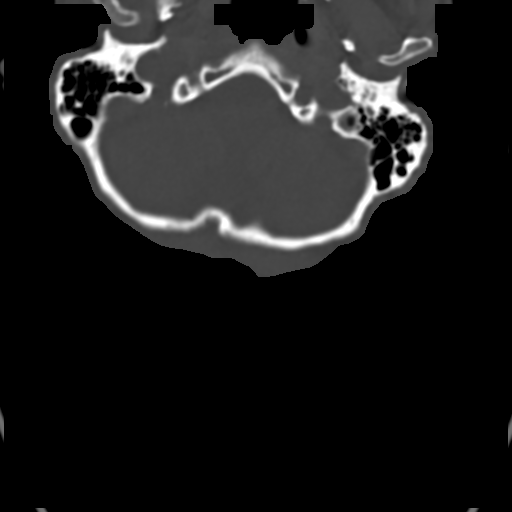

[Series 7: sag bone · sagittal · 0.26mm/px · 5 of 55 slices shown, 6 images]
[im 19/55  bone]
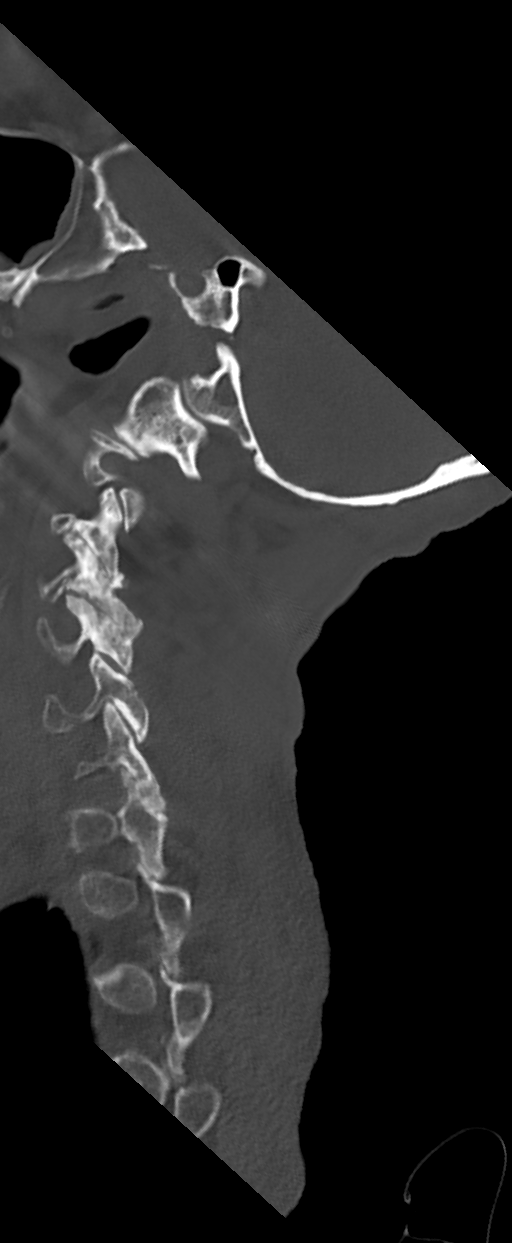
[im 23/55  bone]
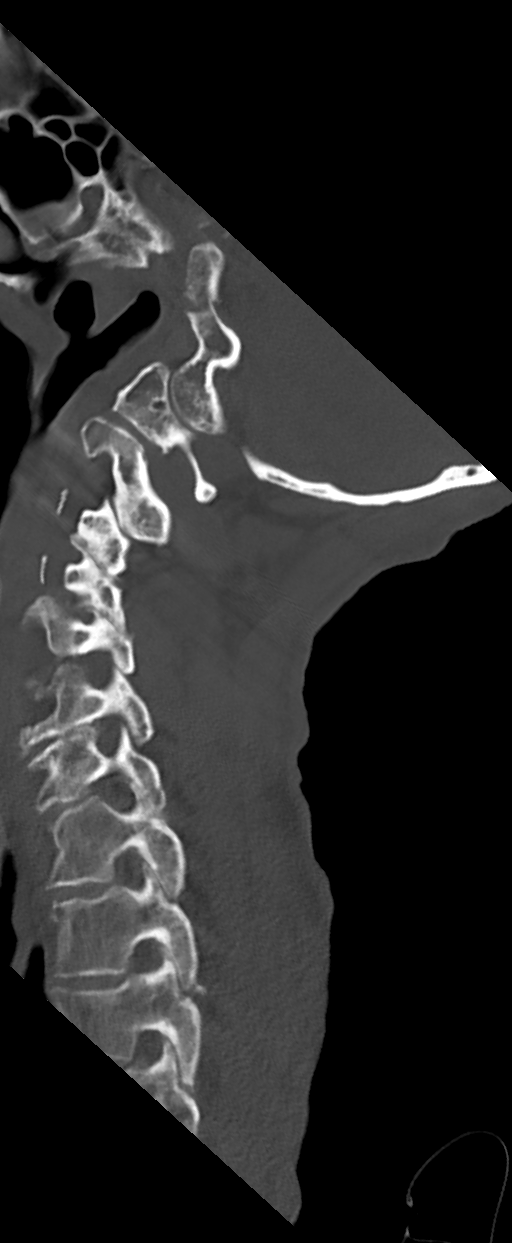
[im 28/55  soft-tissue]
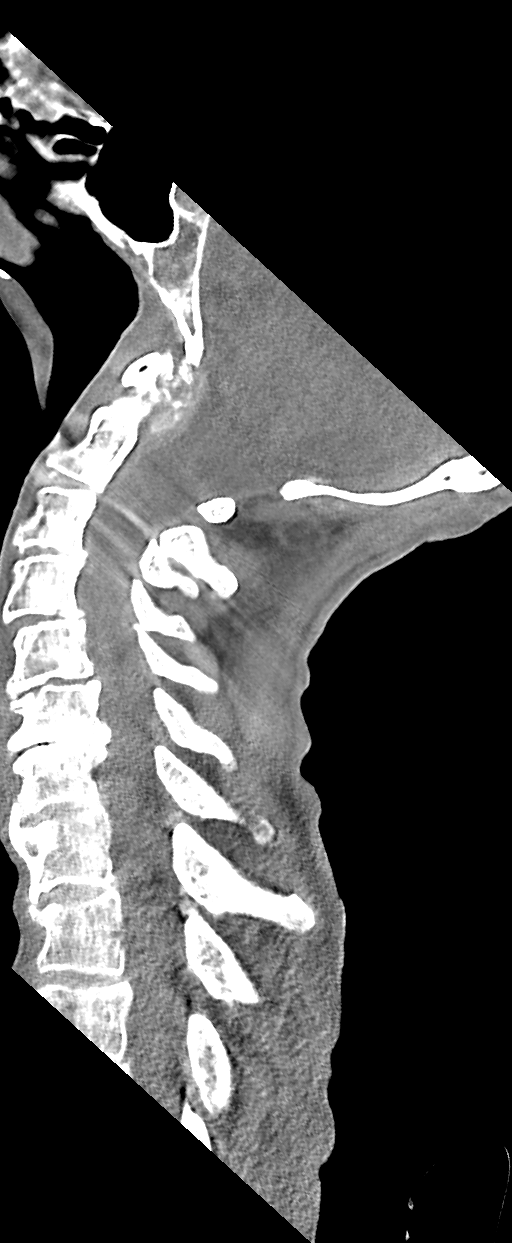
[im 28/55  bone]
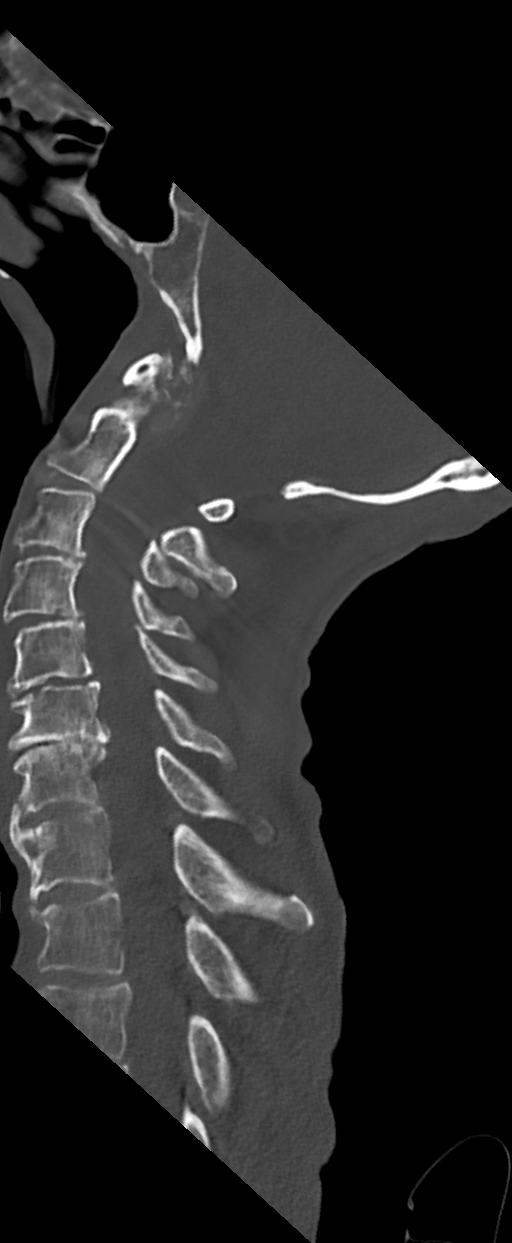
[im 32/55  bone]
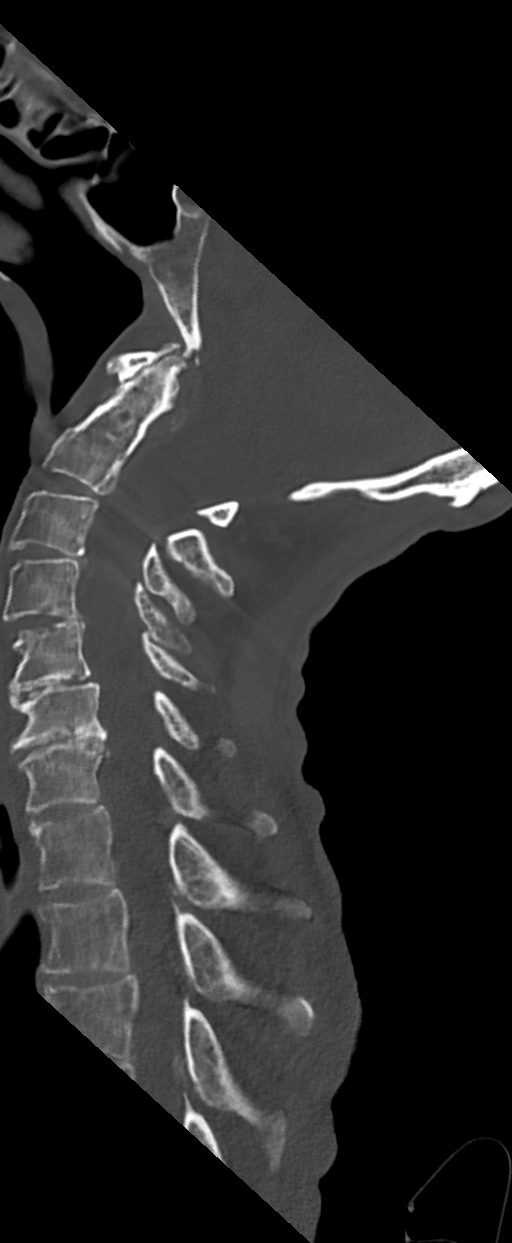
[im 37/55  bone]
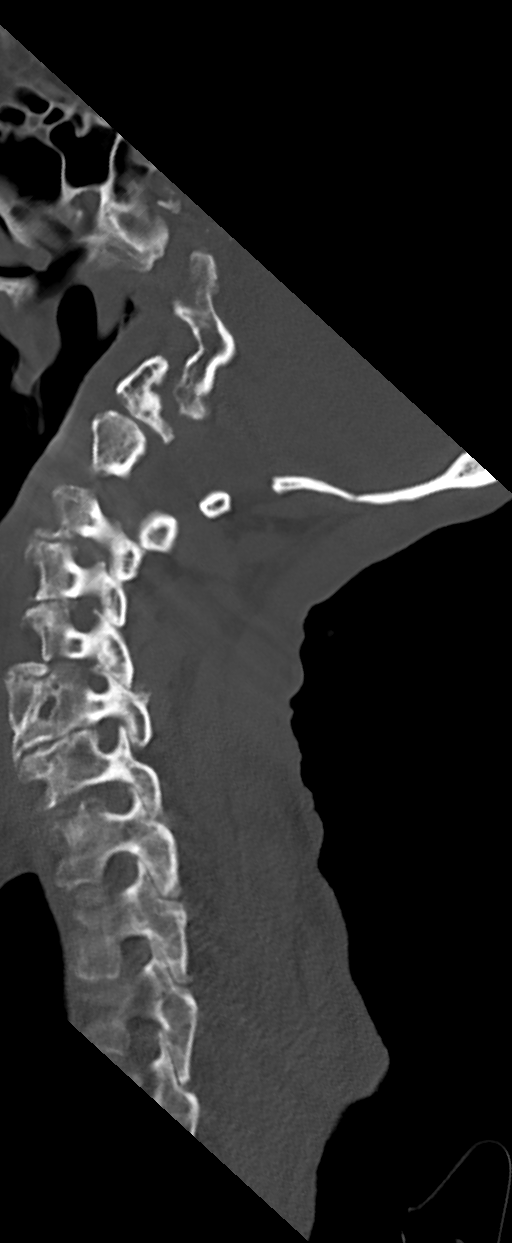

[9 of 27 positions shown; findings below may reference images not displayed]

FINDINGS: Brain: There is a large right temporal lobe parenchymal hemorrhage
measuring at least 4.8 x 4.0 x 2.5 cm with mass effect on the right
temporal horn. No evidence of transtentorial herniation. There is
also trace amount of blood adjacent to the right falx cerebelli.
Multiple cerebral contusions/parenchymal hemorrhages for example in
the left anterior temporal lobe, right anterior temporal lobe, right
frontal lobe,. There is also small intraparenchymal hemorrhage in
the left parietal lobe adjacent to the central sulcus. There is also
small area of contusion/hemorrhage in the superior aspect of the
right frontal lobe (axial image 28). There is mass effect on the
anterior horn of the right lateral ventricle, which is likely
secondary to edema due to large right temporal lobe hemorrhage. No
midline shift or subfalcine herniation.

There is also small frontal subdural hemorrhage measuring up to 4
mm.

Vascular: No hyperdense vessel or unexpected calcification.

Skull: Normal. Negative for fracture or focal lesion.

Sinuses/Orbits: Mucosal thickening of bilateral maxillary sinuses.
No evidence of fracture.

Other: None.
IMPRESSION: 1. Multiple bilateral parenchymal hemorrhage, the largest in the
right temporal lobe measuring at least 4.8 x 4.0 x 2.5 cm with
surrounding edema and mass effect on the right temporal horn,
without evidence of transtentorial herniation.

2.  Small right frontal subdural hemorrhage measuring up to 4 mm.

3.  No evidence of calvarial fracture.

Above findings were reported to Dr. Doughty at approximately [DATE]
p.m. on 04/16/2021.

## 2022-07-21 IMAGING — CT CT MAXILLOFACIAL W/O CM
3 of 6 series · 16 of 47 positions shown, 19 images · non-contrast
Comparison: None.

CLINICAL DATA: Facial trauma, blunt



[Series 3: maxilllofacial 2.0 hr40 3 · axial · 0.39mm/px · z∈[-229,-81]mm · 11 of 88 slices shown, 14 images]
[im 7/88  brain]
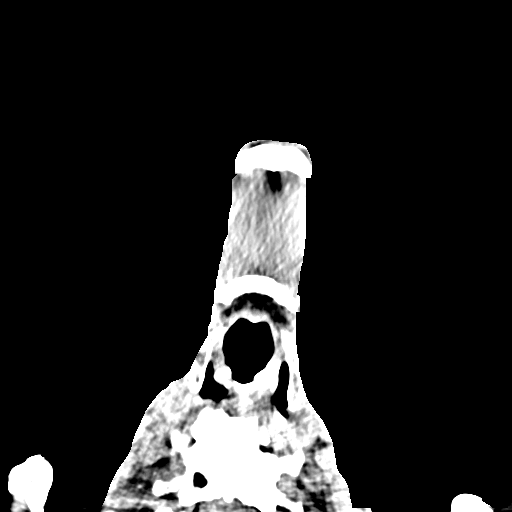
[im 7/88  bone]
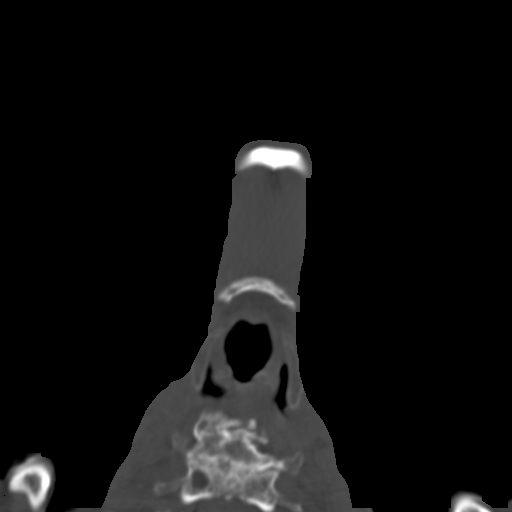
[im 13/88  bone]
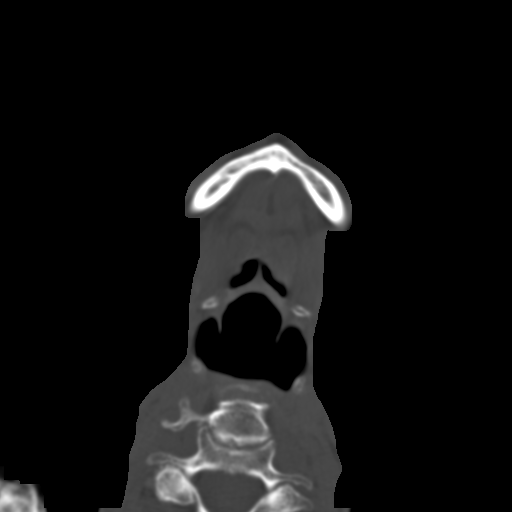
[im 19/88  bone]
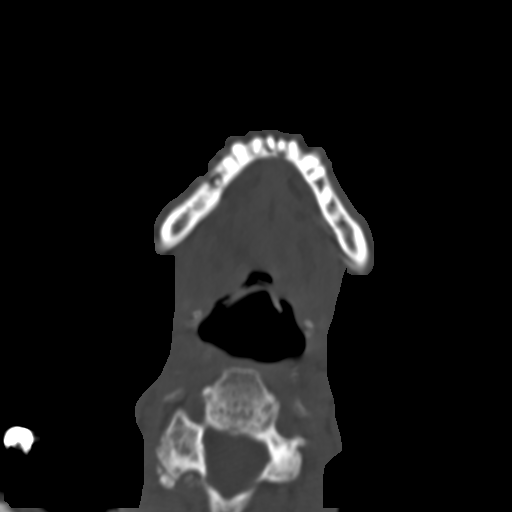
[im 32/88  bone]
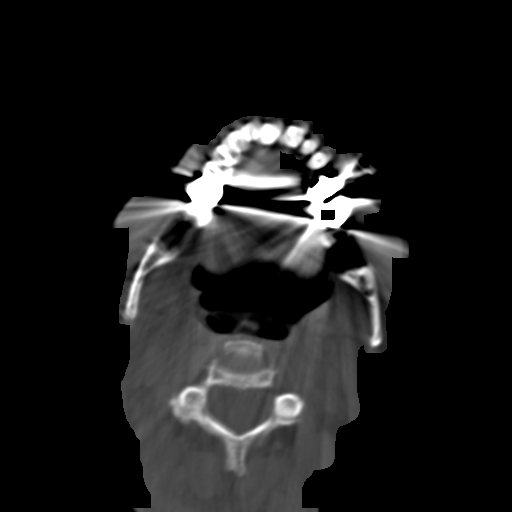
[im 38/88  brain]
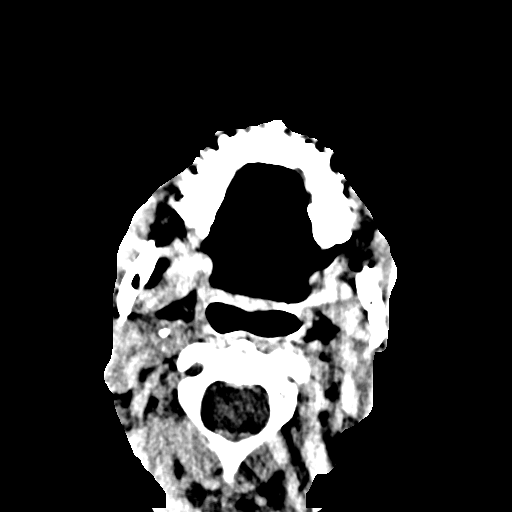
[im 38/88  bone]
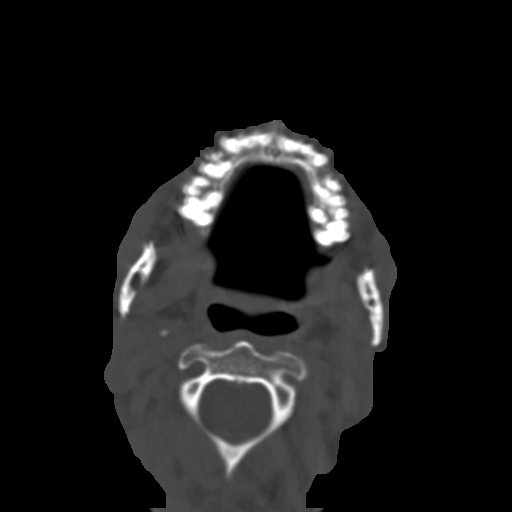
[im 44/88  bone]
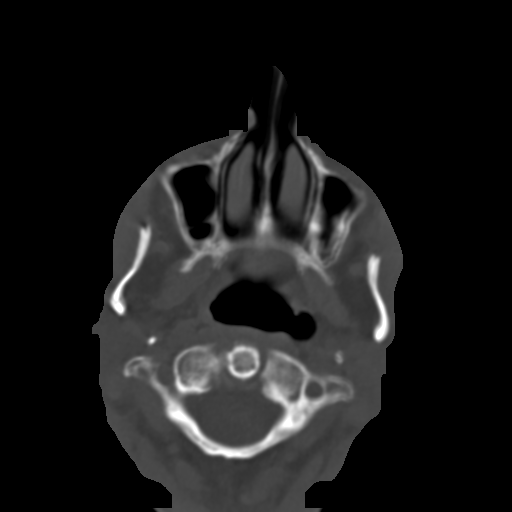
[im 50/88  bone]
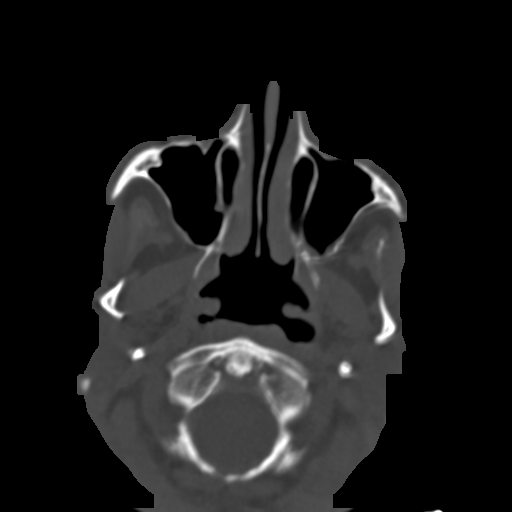
[im 56/88  bone]
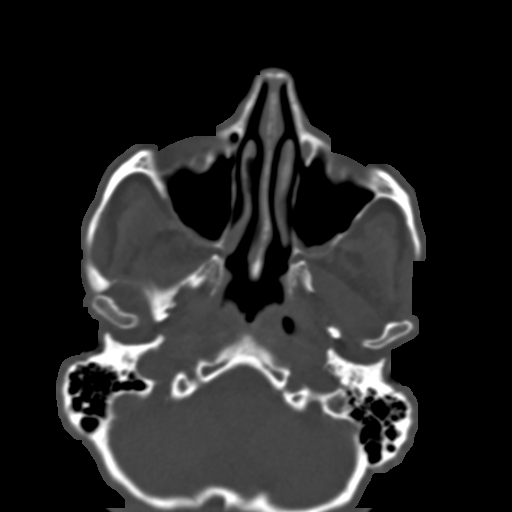
[im 69/88  brain]
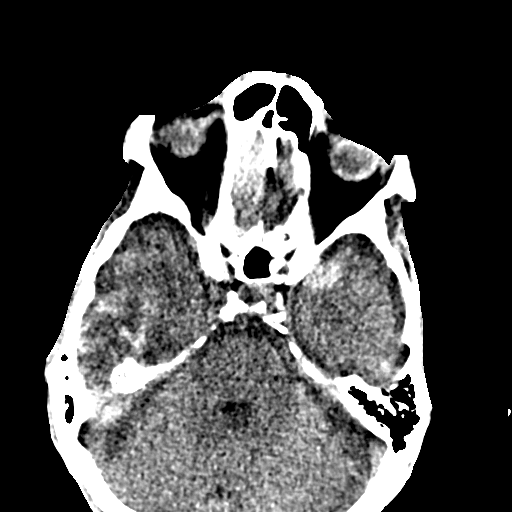
[im 69/88  bone]
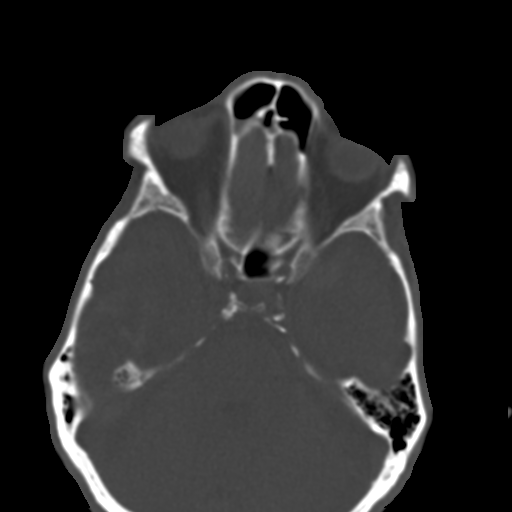
[im 75/88  bone]
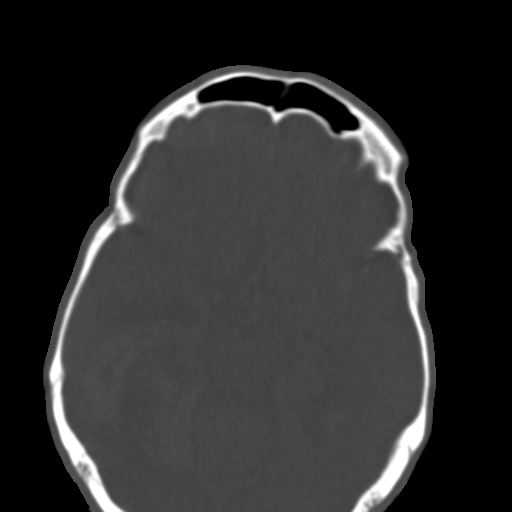
[im 81/88  bone]
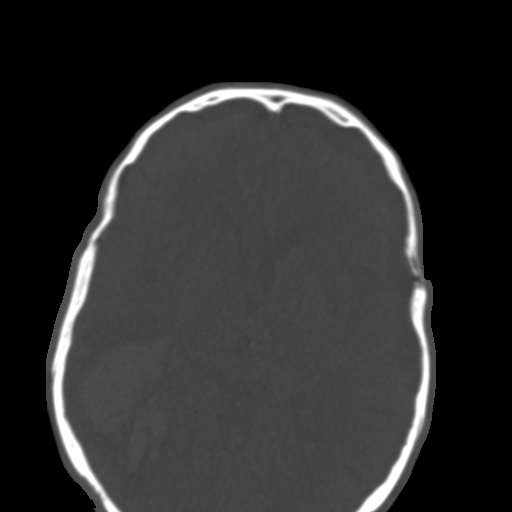

[Series 7: st cor · coronal · 0.36mm/px · 3 of 101 slices shown]
[im 26/101  bone]
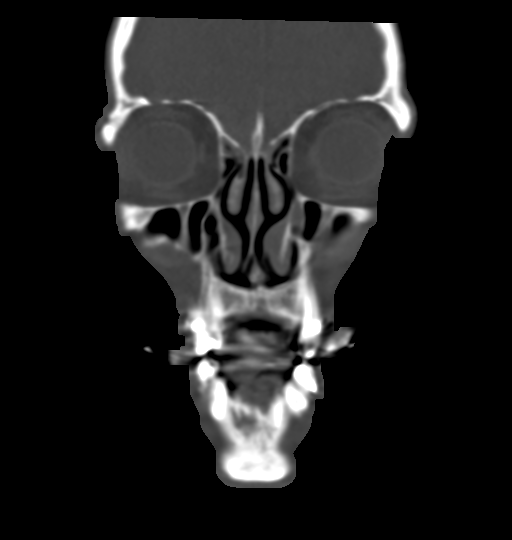
[im 51/101  bone]
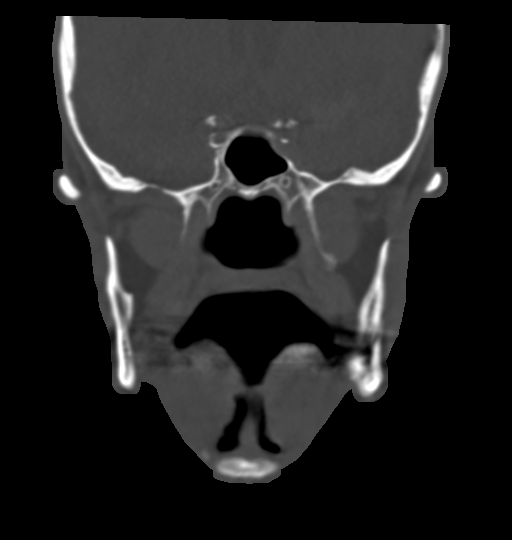
[im 76/101  bone]
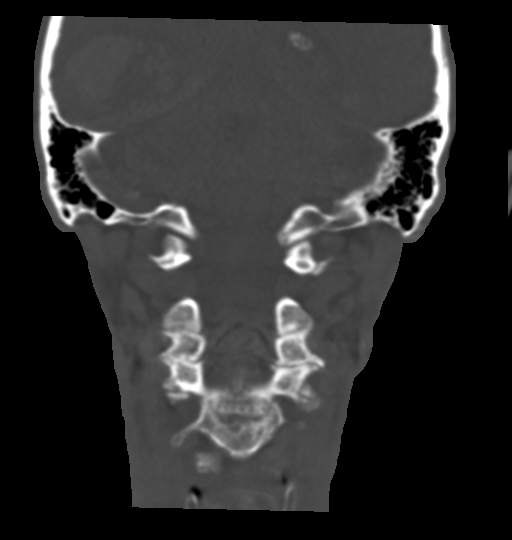

[Series 10: bone sag · sagittal · 0.38mm/px · 2 of 89 slices shown]
[im 30/89  bone]
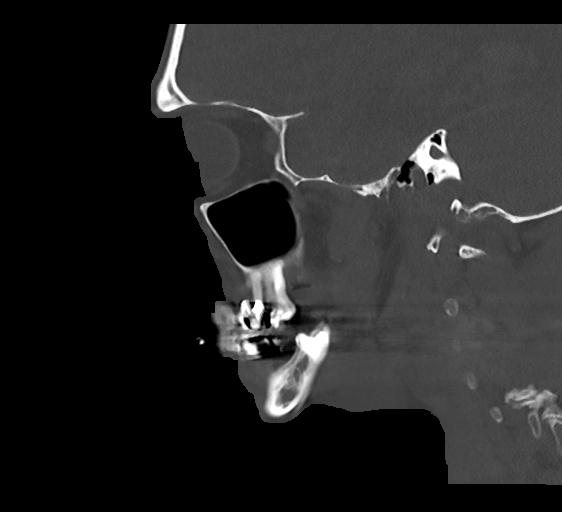
[im 59/89  bone]
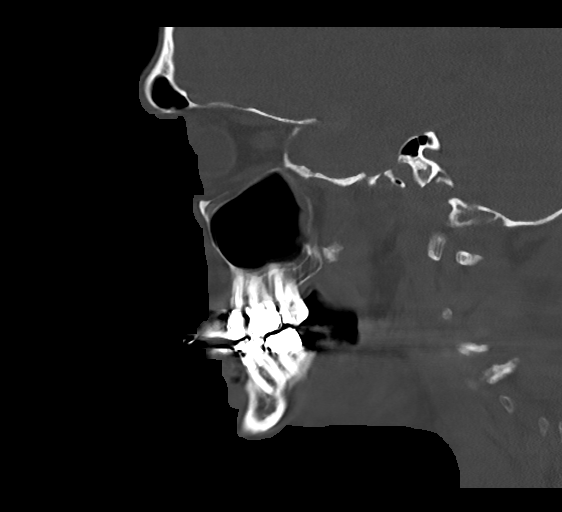

[16 of 47 positions shown; findings below may reference images not displayed]

FINDINGS: Osseous: No fracture or mandibular dislocation. No destructive
process.

Orbits: Negative. No traumatic or inflammatory finding.

Sinuses: Scattered paranasal sinus mucosal thickening.

Soft tissues: Negative.

Limited intracranial: Intracranial hemorrhage is noted and partially
visualized, better seen and described on separately dictated head
CT.
IMPRESSION: No acute facial fracture.

Acute intracranial hemorrhage, see separately dictated head CT.

## 2022-07-22 IMAGING — DX DG CHEST 1V PORT
1 series · 1 of 1 positions shown · non-contrast
Comparison: None.

CLINICAL DATA: Subdural hematoma, fever

EXAM:
PORTABLE CHEST 1 VIEW

[chest]
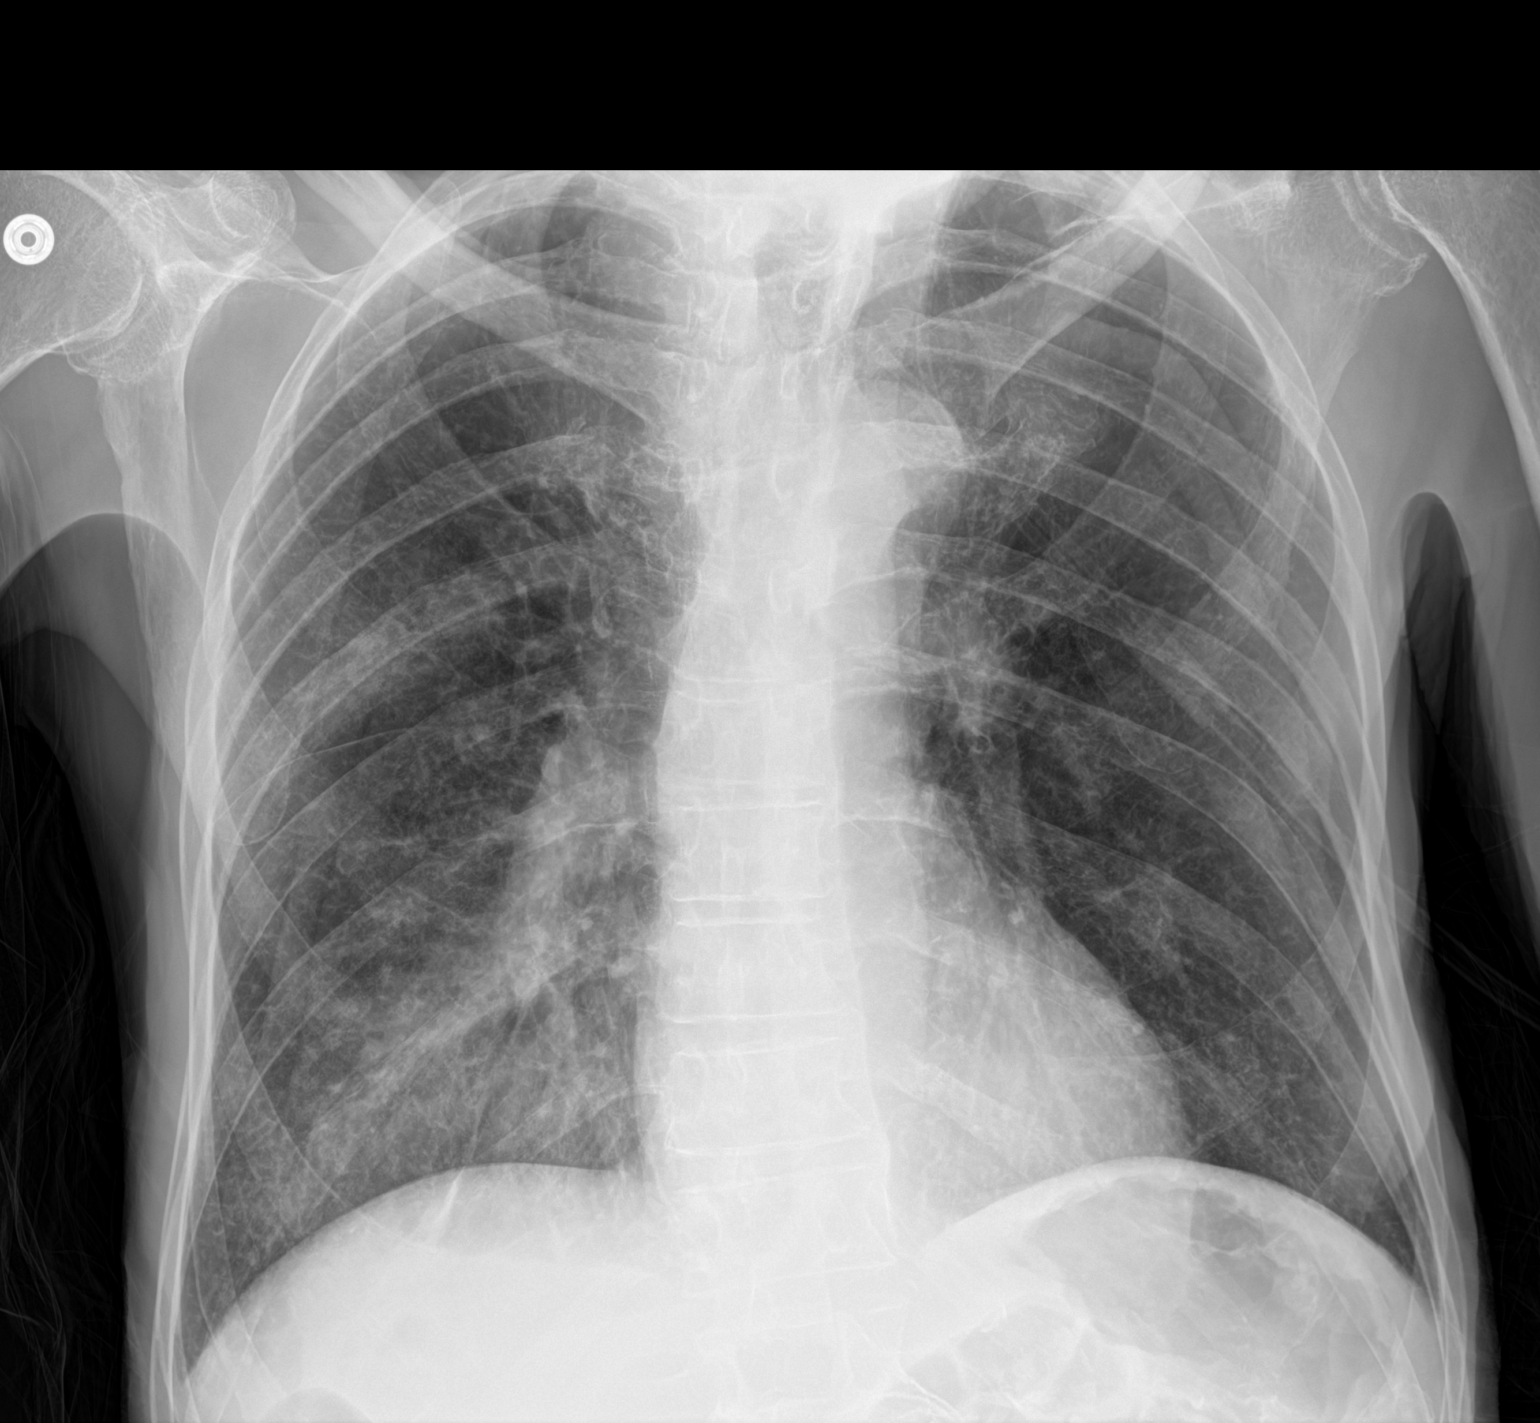

[1 of 1 positions shown; findings below may reference images not displayed]

FINDINGS: Patchy right lower lobe airspace disease concerning for pneumonia.
No pleural effusion or pneumothorax. Heart and mediastinal contours
are unremarkable.

No acute osseous abnormality.
IMPRESSION: 1. Patchy right lower lobe airspace disease concerning for
pneumonia.

## 2023-01-13 ENCOUNTER — Encounter (HOSPITAL_COMMUNITY): Payer: Self-pay
